# Patient Record
Sex: Female | Born: 1955 | Race: Black or African American | Hispanic: No | Marital: Married | Smoking: Current every day smoker
Health system: Southern US, Community
[De-identification: ages and names within clinical notes are randomized; demographics above are authoritative.]

## PROBLEM LIST (undated history)

## (undated) DIAGNOSIS — M549 Dorsalgia, unspecified: Secondary | ICD-10-CM

## (undated) DIAGNOSIS — F329 Major depressive disorder, single episode, unspecified: Secondary | ICD-10-CM

## (undated) DIAGNOSIS — E119 Type 2 diabetes mellitus without complications: Secondary | ICD-10-CM

## (undated) DIAGNOSIS — F32A Depression, unspecified: Secondary | ICD-10-CM

## (undated) DIAGNOSIS — F419 Anxiety disorder, unspecified: Secondary | ICD-10-CM

## (undated) DIAGNOSIS — G8929 Other chronic pain: Secondary | ICD-10-CM

## (undated) DIAGNOSIS — I10 Essential (primary) hypertension: Secondary | ICD-10-CM

## (undated) DIAGNOSIS — E7800 Pure hypercholesterolemia, unspecified: Secondary | ICD-10-CM

## (undated) DIAGNOSIS — M069 Rheumatoid arthritis, unspecified: Secondary | ICD-10-CM

## (undated) DIAGNOSIS — M5030 Other cervical disc degeneration, unspecified cervical region: Secondary | ICD-10-CM

## (undated) HISTORY — PX: OTHER SURGICAL HISTORY: SHX169

## (undated) HISTORY — PX: CERVICAL DISC SURGERY: SHX588

## (undated) HISTORY — DX: Other chronic pain: G89.29

## (undated) HISTORY — DX: Dorsalgia, unspecified: M54.9

---

## 2012-04-13 ENCOUNTER — Emergency Department (HOSPITAL_BASED_OUTPATIENT_CLINIC_OR_DEPARTMENT_OTHER)
Admission: EM | Admit: 2012-04-13 | Discharge: 2012-04-13 | Disposition: A | Discharge status: Home / Self Care | Payer: Medicaid Other | Attending: Emergency Medicine | Admitting: Emergency Medicine

## 2012-04-13 ENCOUNTER — Encounter (HOSPITAL_BASED_OUTPATIENT_CLINIC_OR_DEPARTMENT_OTHER): Payer: Self-pay | Admitting: Emergency Medicine

## 2012-04-13 DIAGNOSIS — Y93E1 Activity, personal bathing and showering: Secondary | ICD-10-CM | POA: Insufficient documentation

## 2012-04-13 DIAGNOSIS — F329 Major depressive disorder, single episode, unspecified: Secondary | ICD-10-CM | POA: Insufficient documentation

## 2012-04-13 DIAGNOSIS — W010XXA Fall on same level from slipping, tripping and stumbling without subsequent striking against object, initial encounter: Secondary | ICD-10-CM | POA: Insufficient documentation

## 2012-04-13 DIAGNOSIS — F3289 Other specified depressive episodes: Secondary | ICD-10-CM | POA: Insufficient documentation

## 2012-04-13 DIAGNOSIS — M549 Dorsalgia, unspecified: Secondary | ICD-10-CM

## 2012-04-13 DIAGNOSIS — R52 Pain, unspecified: Secondary | ICD-10-CM | POA: Insufficient documentation

## 2012-04-13 DIAGNOSIS — F411 Generalized anxiety disorder: Secondary | ICD-10-CM | POA: Insufficient documentation

## 2012-04-13 DIAGNOSIS — Y92009 Unspecified place in unspecified non-institutional (private) residence as the place of occurrence of the external cause: Secondary | ICD-10-CM | POA: Insufficient documentation

## 2012-04-13 DIAGNOSIS — G8929 Other chronic pain: Secondary | ICD-10-CM | POA: Insufficient documentation

## 2012-04-13 DIAGNOSIS — M069 Rheumatoid arthritis, unspecified: Secondary | ICD-10-CM | POA: Insufficient documentation

## 2012-04-13 DIAGNOSIS — F17200 Nicotine dependence, unspecified, uncomplicated: Secondary | ICD-10-CM | POA: Insufficient documentation

## 2012-04-13 DIAGNOSIS — Z885 Allergy status to narcotic agent status: Secondary | ICD-10-CM | POA: Insufficient documentation

## 2012-04-13 HISTORY — DX: Anxiety disorder, unspecified: F41.9

## 2012-04-13 HISTORY — DX: Depression, unspecified: F32.A

## 2012-04-13 HISTORY — DX: Major depressive disorder, single episode, unspecified: F32.9

## 2012-04-13 HISTORY — DX: Rheumatoid arthritis, unspecified: M06.9

## 2012-04-13 MED ORDER — OXYCODONE-ACETAMINOPHEN 5-325 MG PO TABS: 1.0000 | ORAL_TABLET | ORAL | Status: DC | PRN

## 2012-04-13 MED ORDER — OXYCODONE-ACETAMINOPHEN 5-325 MG PO TABS
1.0000 | ORAL_TABLET | Freq: Once | ORAL | Status: AC
  Administered 2012-04-13: 1 via ORAL
  Filled 2012-04-13 (×2): qty 1

## 2012-04-13 NOTE — ED Provider Notes (Signed)
Medical screening examination/treatment/procedure(s) were performed by non-physician practitioner and as supervising physician I was immediately available for consultation/collaboration.   Carlo Guevarra, MD 04/13/12 2102 

## 2012-04-13 NOTE — ED Notes (Signed)
Reports low back pain after slipping while trying to get out of the bath tub

## 2012-04-13 NOTE — ED Provider Notes (Signed)
History     CSN: [id number]  Arrival date & time 04/13/12  1556   First MD Initiated Contact with Patient 04/13/12 1615      Chief Complaint  Patient presents with  . Back Pain    (Consider location/radiation/quality/duration/timing/severity/associated sxs/prior treatment) Patient is a 56 y.o. female presenting with back pain. The history is provided by the patient.  Back Pain  This is a new problem. Pertinent negatives include no fever, no numbness and no weakness. Associated symptoms comments: Patient with history of rheumatoid arthritis and chronic low back pain fell while in the shower tonight causing exacerbation of familiar back pain. She lost balance and fell onto her side. She did not strike the back against anything. No numbness, paresthesias, extremity weakness, urinary or bowel incontinence..    Past Medical History  Diagnosis Date  . Rheumatoid arthritis   . Anxiety   . Depression     Past Surgical History  Procedure Date  . Disc removed     No family history on file.  History  Substance Use Topics  . Smoking status: Current Every Day Smoker  . Smokeless tobacco: Not on file  . Alcohol Use: No    OB History    Grav Para Term Preterm Abortions TAB SAB Ect Mult Living                  Review of Systems  Constitutional: Negative for fever and chills.  Gastrointestinal: Negative.   Genitourinary: Negative for difficulty urinating.  Musculoskeletal: Positive for back pain.  Skin: Negative.   Neurological: Negative.  Negative for weakness and numbness.    Allergies  Hydrocodone  Home Medications  No current outpatient prescriptions on file.  BP 121/87  Pulse 90  Temp 98.3 F (36.8 C) (Oral)  Resp 16  Ht 5' 9.5" (1.765 m)  Wt 208 lb (94.348 kg)  BMI 30.28 kg/m2  SpO2 99%  Physical Exam  Constitutional: She is oriented to person, place, and time. She appears well-developed and well-nourished.  HENT:  Head: Normocephalic.  Neck: Normal  range of motion. Neck supple.  Cardiovascular: Normal rate and regular rhythm.   Pulmonary/Chest: Effort normal and breath sounds normal.  Abdominal: Soft. Bowel sounds are normal. There is no tenderness. There is no rebound and no guarding.  Musculoskeletal: Normal range of motion.       Mild lumbar and paralumbar tenderness without swelling or discoloration.  Neurological: She is alert and oriented to person, place, and time. She has normal reflexes. Coordination normal.  Skin: Skin is warm and dry. No rash noted.  Psychiatric: She has a normal mood and affect.    ED Course  Procedures (including critical care time)  Labs Reviewed - No data to display No results found.   No diagnosis found. 1. Acute on chronic back pain   MDM  Patient ambulates without ataxia. Acute on chronic back pain without further complication.        Rodena Medin, PA-C 04/13/12 1811

## 2012-09-02 ENCOUNTER — Encounter (HOSPITAL_BASED_OUTPATIENT_CLINIC_OR_DEPARTMENT_OTHER): Payer: Self-pay | Admitting: *Deleted

## 2012-09-02 ENCOUNTER — Emergency Department (HOSPITAL_BASED_OUTPATIENT_CLINIC_OR_DEPARTMENT_OTHER)
Admission: EM | Admit: 2012-09-02 | Discharge: 2012-09-02 | Disposition: A | Discharge status: Home / Self Care | Payer: Medicaid Other | Attending: Emergency Medicine | Admitting: Emergency Medicine

## 2012-09-02 DIAGNOSIS — F329 Major depressive disorder, single episode, unspecified: Secondary | ICD-10-CM | POA: Insufficient documentation

## 2012-09-02 DIAGNOSIS — M545 Low back pain, unspecified: Secondary | ICD-10-CM | POA: Insufficient documentation

## 2012-09-02 DIAGNOSIS — F411 Generalized anxiety disorder: Secondary | ICD-10-CM | POA: Insufficient documentation

## 2012-09-02 DIAGNOSIS — Z9889 Other specified postprocedural states: Secondary | ICD-10-CM | POA: Insufficient documentation

## 2012-09-02 DIAGNOSIS — Z87828 Personal history of other (healed) physical injury and trauma: Secondary | ICD-10-CM | POA: Insufficient documentation

## 2012-09-02 DIAGNOSIS — F3289 Other specified depressive episodes: Secondary | ICD-10-CM | POA: Insufficient documentation

## 2012-09-02 DIAGNOSIS — F17200 Nicotine dependence, unspecified, uncomplicated: Secondary | ICD-10-CM | POA: Insufficient documentation

## 2012-09-02 DIAGNOSIS — M069 Rheumatoid arthritis, unspecified: Secondary | ICD-10-CM | POA: Insufficient documentation

## 2012-09-02 DIAGNOSIS — Z79899 Other long term (current) drug therapy: Secondary | ICD-10-CM | POA: Insufficient documentation

## 2012-09-02 MED ORDER — OXYCODONE-ACETAMINOPHEN 5-325 MG PO TABS: 2.0000 | ORAL_TABLET | ORAL | Status: DC | PRN

## 2012-09-02 MED ORDER — PREDNISONE 10 MG PO TABS: ORAL_TABLET | ORAL | Status: DC

## 2012-09-02 MED ORDER — OXYCODONE-ACETAMINOPHEN 5-325 MG PO TABS
2.0000 | ORAL_TABLET | Freq: Once | ORAL | Status: AC
  Administered 2012-09-02: 2 via ORAL
  Filled 2012-09-02 (×2): qty 2
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
## 2012-09-04 NOTE — ED Provider Notes (Signed)
Medical screening examination/treatment/procedure(s) were performed by non-physician practitioner and as supervising physician I was immediately available for consultation/collaboration.   Scott W. Zackowski, MD 09/04/12 0108 

## 2012-11-18 ENCOUNTER — Emergency Department (HOSPITAL_BASED_OUTPATIENT_CLINIC_OR_DEPARTMENT_OTHER)
Admission: EM | Admit: 2012-11-18 | Discharge: 2012-11-18 | Disposition: A | Discharge status: Home / Self Care | Payer: Medicaid Other | Attending: Emergency Medicine | Admitting: Emergency Medicine

## 2012-11-18 ENCOUNTER — Encounter (HOSPITAL_BASED_OUTPATIENT_CLINIC_OR_DEPARTMENT_OTHER): Payer: Self-pay | Admitting: *Deleted

## 2012-11-18 DIAGNOSIS — F17200 Nicotine dependence, unspecified, uncomplicated: Secondary | ICD-10-CM | POA: Insufficient documentation

## 2012-11-18 DIAGNOSIS — M069 Rheumatoid arthritis, unspecified: Secondary | ICD-10-CM | POA: Insufficient documentation

## 2012-11-18 DIAGNOSIS — F329 Major depressive disorder, single episode, unspecified: Secondary | ICD-10-CM | POA: Insufficient documentation

## 2012-11-18 DIAGNOSIS — Z79899 Other long term (current) drug therapy: Secondary | ICD-10-CM | POA: Insufficient documentation

## 2012-11-18 DIAGNOSIS — Y92410 Unspecified street and highway as the place of occurrence of the external cause: Secondary | ICD-10-CM | POA: Insufficient documentation

## 2012-11-18 DIAGNOSIS — F411 Generalized anxiety disorder: Secondary | ICD-10-CM | POA: Insufficient documentation

## 2012-11-18 DIAGNOSIS — F3289 Other specified depressive episodes: Secondary | ICD-10-CM | POA: Insufficient documentation

## 2012-11-18 DIAGNOSIS — Y9389 Activity, other specified: Secondary | ICD-10-CM | POA: Insufficient documentation

## 2012-11-18 DIAGNOSIS — Z9889 Other specified postprocedural states: Secondary | ICD-10-CM | POA: Insufficient documentation

## 2012-11-18 DIAGNOSIS — S335XXA Sprain of ligaments of lumbar spine, initial encounter: Secondary | ICD-10-CM | POA: Insufficient documentation

## 2012-11-18 DIAGNOSIS — S39012A Strain of muscle, fascia and tendon of lower back, initial encounter: Secondary | ICD-10-CM

## 2012-11-18 MED ORDER — PREDNISONE 10 MG PO TABS: ORAL_TABLET | ORAL | Status: DC

## 2012-11-18 MED ORDER — OXYCODONE-ACETAMINOPHEN 5-325 MG PO TABS: 2.0000 | ORAL_TABLET | ORAL | Status: DC | PRN
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
## 2012-11-18 NOTE — ED Provider Notes (Signed)
  Medical screening examination/treatment/procedure(s) were performed by non-physician practitioner and as supervising physician I was immediately available for consultation/collaboration.    Gerhard Munch, MD 11/18/12 [phone]
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
## 2012-11-24 ENCOUNTER — Ambulatory Visit: Payer: Medicaid Other | Admitting: Family Medicine

## 2012-11-25 ENCOUNTER — Encounter: Payer: Self-pay | Admitting: Family Medicine

## 2012-11-25 ENCOUNTER — Ambulatory Visit (INDEPENDENT_AMBULATORY_CARE_PROVIDER_SITE_OTHER): Payer: Medicaid Other | Admitting: Family Medicine

## 2012-11-25 VITALS — BP 131/88 | HR 82 | Ht 70.0 in | Wt 208.0 lb

## 2012-11-25 DIAGNOSIS — M549 Dorsalgia, unspecified: Secondary | ICD-10-CM

## 2012-11-25 DIAGNOSIS — M25519 Pain in unspecified shoulder: Secondary | ICD-10-CM

## 2012-11-25 DIAGNOSIS — G8929 Other chronic pain: Secondary | ICD-10-CM

## 2012-11-25 DIAGNOSIS — M25511 Pain in right shoulder: Secondary | ICD-10-CM

## 2012-11-25 MED ORDER — MELOXICAM 15 MG PO TABS: 15.0000 mg | ORAL_TABLET | Freq: Every day | ORAL | Status: DC

## 2012-11-25 NOTE — Patient Instructions (Addendum)
You have rotator cuff impingement Try to avoid painful activities (overhead activities, lifting with extended arm) as much as possible. Meloxicam 15mg  daily with food for pain and inflammation. Subacromial injection may be beneficial to help with pain and to decrease inflammation. Do home exercise program with theraband and scapular stabilization exercises daily - these are very important for long term relief even if an injection was given. If not improving at follow-up we will consider further imaging and/or physical therapy. Follow up with me in 6 weeks for your shoulder.  We will refer you to the physiatrist for your chronic low back pain given you've tried most of the conservative measures and have not improved.

## 2012-11-26 ENCOUNTER — Encounter: Payer: Self-pay | Admitting: Family Medicine

## 2012-11-26 DIAGNOSIS — G8929 Other chronic pain: Secondary | ICD-10-CM | POA: Insufficient documentation

## 2012-11-26 DIAGNOSIS — M25511 Pain in right shoulder: Secondary | ICD-10-CM | POA: Insufficient documentation

## 2012-11-26 NOTE — Assessment & Plan Note (Signed)
we do not have anything further to offer her than what she's done to this point.  Has completed extensive physical therapy, nsaids (will place her on meloxicam for this and shoulder though), ESIs, tried cymbalta.  Advised we move forward with PM&R referral for further treatment recommendations.
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
## 2012-11-26 NOTE — Assessment & Plan Note (Signed)
2/2 rotator cuff impingement.  Start home exercise program and meloxicam.  Consider formal PT, subacromial injection, nitro patches if not improving over next 6 weeks.

## 2013-01-06 ENCOUNTER — Ambulatory Visit: Payer: Medicaid Other | Admitting: Family Medicine

## 2013-01-12 ENCOUNTER — Encounter (HOSPITAL_BASED_OUTPATIENT_CLINIC_OR_DEPARTMENT_OTHER): Payer: Self-pay | Admitting: *Deleted

## 2013-01-12 ENCOUNTER — Ambulatory Visit (INDEPENDENT_AMBULATORY_CARE_PROVIDER_SITE_OTHER): Payer: Medicaid Other | Admitting: Family Medicine

## 2013-01-12 ENCOUNTER — Emergency Department (HOSPITAL_BASED_OUTPATIENT_CLINIC_OR_DEPARTMENT_OTHER)
Admission: EM | Admit: 2013-01-12 | Discharge: 2013-01-12 | Disposition: A | Discharge status: Home / Self Care | Payer: Medicaid Other | Attending: Emergency Medicine | Admitting: Emergency Medicine

## 2013-01-12 ENCOUNTER — Encounter: Payer: Self-pay | Admitting: Family Medicine

## 2013-01-12 VITALS — BP 128/85 | HR 69 | Ht 70.0 in | Wt 197.0 lb

## 2013-01-12 DIAGNOSIS — G8929 Other chronic pain: Secondary | ICD-10-CM

## 2013-01-12 DIAGNOSIS — F411 Generalized anxiety disorder: Secondary | ICD-10-CM | POA: Insufficient documentation

## 2013-01-12 DIAGNOSIS — Y929 Unspecified place or not applicable: Secondary | ICD-10-CM | POA: Insufficient documentation

## 2013-01-12 DIAGNOSIS — Y9389 Activity, other specified: Secondary | ICD-10-CM | POA: Insufficient documentation

## 2013-01-12 DIAGNOSIS — W010XXA Fall on same level from slipping, tripping and stumbling without subsequent striking against object, initial encounter: Secondary | ICD-10-CM | POA: Insufficient documentation

## 2013-01-12 DIAGNOSIS — F3289 Other specified depressive episodes: Secondary | ICD-10-CM | POA: Insufficient documentation

## 2013-01-12 DIAGNOSIS — Z8739 Personal history of other diseases of the musculoskeletal system and connective tissue: Secondary | ICD-10-CM | POA: Insufficient documentation

## 2013-01-12 DIAGNOSIS — M25511 Pain in right shoulder: Secondary | ICD-10-CM

## 2013-01-12 DIAGNOSIS — E119 Type 2 diabetes mellitus without complications: Secondary | ICD-10-CM | POA: Insufficient documentation

## 2013-01-12 DIAGNOSIS — M25519 Pain in unspecified shoulder: Secondary | ICD-10-CM

## 2013-01-12 DIAGNOSIS — IMO0002 Reserved for concepts with insufficient information to code with codable children: Secondary | ICD-10-CM | POA: Insufficient documentation

## 2013-01-12 DIAGNOSIS — Z79899 Other long term (current) drug therapy: Secondary | ICD-10-CM | POA: Insufficient documentation

## 2013-01-12 DIAGNOSIS — S46909A Unspecified injury of unspecified muscle, fascia and tendon at shoulder and upper arm level, unspecified arm, initial encounter: Secondary | ICD-10-CM | POA: Insufficient documentation

## 2013-01-12 DIAGNOSIS — S4980XA Other specified injuries of shoulder and upper arm, unspecified arm, initial encounter: Secondary | ICD-10-CM | POA: Insufficient documentation

## 2013-01-12 DIAGNOSIS — M549 Dorsalgia, unspecified: Secondary | ICD-10-CM

## 2013-01-12 DIAGNOSIS — F17200 Nicotine dependence, unspecified, uncomplicated: Secondary | ICD-10-CM | POA: Insufficient documentation

## 2013-01-12 DIAGNOSIS — F329 Major depressive disorder, single episode, unspecified: Secondary | ICD-10-CM | POA: Insufficient documentation

## 2013-01-12 HISTORY — DX: Type 2 diabetes mellitus without complications: E11.9

## 2013-01-12 MED ORDER — DIAZEPAM 5 MG PO TABS: ORAL_TABLET | ORAL | Status: DC

## 2013-01-12 MED ORDER — OXYCODONE-ACETAMINOPHEN 5-325 MG PO TABS: ORAL_TABLET | ORAL | Status: DC

## 2013-01-12 NOTE — Patient Instructions (Addendum)
You have rotator cuff impingement Try to avoid painful activities (overhead activities, lifting with extended arm) as much as possible. Aleve and/or tylenol as needed for pain. Subacromial injection may be beneficial to help with pain and to decrease inflammation - you were given this today. Start physical therapy with transition to home exercise program. Do home exercise program with theraband and scapular stabilization exercises daily - these are very important for long term relief even if an injection was given. If after 2 weeks you're still hurting quite a bit call me and we'll send in the nitro patches - if you use these you cut them into 4 equal pieces and put 1/4th of a patch over the painful area.  Every day you take it off and replace it with a new one. If not improving at follow-up we will consider further imaging. Follow up with me in 6 weeks. We'll refer you to a different pain management clinic.
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
## 2013-01-12 NOTE — ED Notes (Signed)
Patient states she fell 5 days ago and hurt her back.  C/O lower back pain with radiation down buttocks and legs.

## 2013-01-13 ENCOUNTER — Encounter: Payer: Self-pay | Admitting: Family Medicine
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
## 2013-01-13 NOTE — Assessment & Plan Note (Addendum)
2/2 rotator cuff impingement.  Start formal physical therapy and subacromial injection which was given today.  If not improving over next 2-3 weeks advised her to call us and would start nitro patches.    After informed written consent, patient was seated on exam table. Right shoulder was prepped with alcohol swab and utilizing posterior approach, patient's right subacromial space was injected with 3:1 marcaine: depomedrol. Patient tolerated the procedure well without immediate complications.

## 2013-01-13 NOTE — Assessment & Plan Note (Signed)
Not reevaluated today - as noted last visit we do not have anything further to offer her than what she's done to this point.  Has completed extensive physical therapy, nsaids (will place her on meloxicam for this and shoulder though), ESIs, tried cymbalta.  Advised we move forward with PM&R referral for further treatment recommendations.

## 2013-01-13 NOTE — ED Provider Notes (Signed)
Medical screening examination/treatment/procedure(s) were performed by non-physician practitioner and as supervising physician I was immediately available for consultation/collaboration.  Pedram Goodchild, MD 01/13/13 1449 

## 2013-01-22 ENCOUNTER — Other Ambulatory Visit: Payer: Self-pay | Admitting: Family Medicine

## 2013-01-22 ENCOUNTER — Telehealth: Payer: Self-pay | Admitting: *Deleted

## 2013-01-22 DIAGNOSIS — M25511 Pain in right shoulder: Secondary | ICD-10-CM

## 2013-01-22 MED ORDER — NITROGLYCERIN 0.2 MG/HR TD PT24: MEDICATED_PATCH | TRANSDERMAL | Status: DC

## 2013-01-22 NOTE — Telephone Encounter (Signed)
See above

## 2013-02-03 NOTE — Addendum Note (Signed)
Addended by: Lenda Kelp on: 02/03/2013 12:28 PM   Modules accepted: Orders

## 2013-02-04 ENCOUNTER — Emergency Department (HOSPITAL_BASED_OUTPATIENT_CLINIC_OR_DEPARTMENT_OTHER)
Admission: EM | Admit: 2013-02-04 | Discharge: 2013-02-04 | Disposition: A | Discharge status: Home / Self Care | Payer: Medicaid Other | Attending: Emergency Medicine | Admitting: Emergency Medicine

## 2013-02-04 ENCOUNTER — Encounter (HOSPITAL_BASED_OUTPATIENT_CLINIC_OR_DEPARTMENT_OTHER): Payer: Self-pay | Admitting: *Deleted

## 2013-02-04 DIAGNOSIS — M25559 Pain in unspecified hip: Secondary | ICD-10-CM | POA: Insufficient documentation

## 2013-02-04 DIAGNOSIS — M5416 Radiculopathy, lumbar region: Secondary | ICD-10-CM

## 2013-02-04 DIAGNOSIS — F3289 Other specified depressive episodes: Secondary | ICD-10-CM | POA: Insufficient documentation

## 2013-02-04 DIAGNOSIS — Y9389 Activity, other specified: Secondary | ICD-10-CM | POA: Insufficient documentation

## 2013-02-04 DIAGNOSIS — F411 Generalized anxiety disorder: Secondary | ICD-10-CM | POA: Insufficient documentation

## 2013-02-04 DIAGNOSIS — F329 Major depressive disorder, single episode, unspecified: Secondary | ICD-10-CM | POA: Insufficient documentation

## 2013-02-04 DIAGNOSIS — Z9889 Other specified postprocedural states: Secondary | ICD-10-CM | POA: Insufficient documentation

## 2013-02-04 DIAGNOSIS — E119 Type 2 diabetes mellitus without complications: Secondary | ICD-10-CM | POA: Insufficient documentation

## 2013-02-04 DIAGNOSIS — W108XXA Fall (on) (from) other stairs and steps, initial encounter: Secondary | ICD-10-CM | POA: Insufficient documentation

## 2013-02-04 DIAGNOSIS — IMO0002 Reserved for concepts with insufficient information to code with codable children: Secondary | ICD-10-CM | POA: Insufficient documentation

## 2013-02-04 DIAGNOSIS — M069 Rheumatoid arthritis, unspecified: Secondary | ICD-10-CM | POA: Insufficient documentation

## 2013-02-04 DIAGNOSIS — F17200 Nicotine dependence, unspecified, uncomplicated: Secondary | ICD-10-CM | POA: Insufficient documentation

## 2013-02-04 DIAGNOSIS — Z79899 Other long term (current) drug therapy: Secondary | ICD-10-CM | POA: Insufficient documentation

## 2013-02-04 DIAGNOSIS — Y929 Unspecified place or not applicable: Secondary | ICD-10-CM | POA: Insufficient documentation

## 2013-02-04 MED ORDER — HYDROCODONE-ACETAMINOPHEN 5-325 MG PO TABS: 2.0000 | ORAL_TABLET | ORAL | Status: DC | PRN

## 2013-02-04 NOTE — ED Notes (Signed)
While reviewing d/c instructions rx for hydrocodone noted, pt allergies reviewed, pt states hydrocodone makes her itch and have a rash. Dr. Blinda Leatherwood informed, rx for hydrocodone cancelled. Pt instructed to take motrin or tylenol for her pain at home until her pain mgt apt on Monday. Pt verbalizes understanding.

## 2013-02-04 NOTE — ED Notes (Signed)
[facility] phoned, Grayland Jack states rx will be cancelled.

## 2013-02-04 NOTE — ED Notes (Signed)
Pt amb to room 10 with slow steady gait in nad. Pt reports fall on steps Sunday, [facility] and had xrays and mri. Pt states she cont to have pain, saw her pcp this am and was told to come to ed for pain medications. Pt states she has apt with a pain clinic on Monday and just needs enough pain medication to last until then.

## 2013-02-04 NOTE — ED Provider Notes (Signed)
CSN: [id number]     Arrival date & time 02/04/13  1031 History     First MD Initiated Contact with Patient 02/04/13 1039     Chief Complaint  Patient presents with  . Shoulder Pain  . Hip Pain  . Fall   (Consider location/radiation/quality/duration/timing/severity/associated sxs/prior Treatment) HPI Comments: Patient presents to the ER for evaluation of back pain. Patient reports that she has a history of chronic pain, but several days ago had a fall. She reports a twisting motion when she fell and has had left lower back pain radiating into her back since the fall. She reports being seen [facility] and had an MRI. She was told that it was a pinched nerve, has followup scheduled in 4 days with pain management specialist. No change in bowel or bladder function. No loss of strength or sensation in her lower extremities. Patient also complaining of right shoulder pain. She has had chronic pain in the shoulder and have it evaluated during her previous hospital ER visit as well.  Patient is a 57 y.o. female presenting with shoulder pain, hip pain, and fall.  Shoulder Pain  Hip Pain  Fall    Past Medical History  Diagnosis Date  . Rheumatoid arthritis(714.0)   . Anxiety   . Depression   . Chronic back pain   . Diabetes mellitus without complication    Past Surgical History  Procedure Laterality Date  . Disc removed    . Cervical disc surgery     Family History  Problem Relation Age of Onset  . Hyperlipidemia Mother   . Hypertension Mother   . Diabetes Mother   . Hypertension Father   . Diabetes Sister   . Heart attack Neg Hx   . Sudden death Neg Hx    History  Substance Use Topics  . Smoking status: Current Every Day Smoker -- 0.25 packs/day    Types: Cigarettes  . Smokeless tobacco: Never Used  . Alcohol Use: Yes     Comment: occasional   OB History   Grav Para Term Preterm Abortions TAB SAB Ect Mult Living                 Review of Systems  Musculoskeletal:  Positive for back pain and arthralgias.  Neurological: Negative.   All other systems reviewed and are negative.    Allergies  Hydrocodone  Home Medications   Current Outpatient Rx  Name  Route  Sig  Dispense  Refill  . cyclobenzaprine (FLEXERIL) 10 MG tablet   Oral   Take 10 mg by mouth 3 (three) times daily as needed for muscle spasms.         Marland Kitchen atorvastatin (LIPITOR) 40 MG tablet   Oral   Take 40 mg by mouth daily.         . diazepam (VALIUM) 5 MG tablet      Take 1 tablet (5 mg total) by mouth at bedtime as needed for muscle relaxer   5 tablet   0   . HYDROcodone-acetaminophen (NORCO/VICODIN) 5-325 MG per tablet   Oral   Take 2 tablets by mouth every 4 (four) hours as needed for pain.   25 tablet   0   . meloxicam (MOBIC) 15 MG tablet   Oral   Take 1 tablet (15 mg total) by mouth daily. With food.   30 tablet   1   . metFORMIN (GLUCOPHAGE) 500 MG tablet   Oral   Take 500 mg by  mouth 2 (two) times daily with a meal.         . nitroGLYCERIN (NITRODUR - DOSED IN MG/24 HR) 0.2 mg/hr      Use 1/4th of a patch over affected shoulder.  Change daily.   30 patch   1   . oxyCODONE-acetaminophen (PERCOCET/ROXICET) 5-325 MG per tablet      Take one or two tables by mouth every 4 to 6 hours as needed for pain.   15 tablet   0   . predniSONE (STERAPRED UNI-PAK) 10 MG tablet   Oral   Take 10 mg by mouth daily.          BP 137/90  Pulse 87  Temp(Src) 97.5 F (36.4 C) (Oral)  Ht 5\' 9"  (1.753 m)  Wt 211 lb (95.709 kg)  BMI 31.15 kg/m2  SpO2 97% Physical Exam  Constitutional: She is oriented to person, place, and time. She appears well-developed and well-nourished. No distress.  HENT:  Head: Normocephalic and atraumatic.  Right Ear: Hearing normal.  Left Ear: Hearing normal.  Nose: Nose normal.  Mouth/Throat: Oropharynx is clear and moist and mucous membranes are normal.  Eyes: Conjunctivae and EOM are normal. Pupils are equal, round, and reactive  to light.  Neck: Normal range of motion. Neck supple.  Cardiovascular: Regular rhythm, S1 normal and S2 normal.  Exam reveals no gallop and no friction rub.   No murmur heard. Pulmonary/Chest: Effort normal and breath sounds normal. No respiratory distress. She exhibits no tenderness.  Abdominal: Soft. Normal appearance and bowel sounds are normal. There is no hepatosplenomegaly. There is no tenderness. There is no rebound, no guarding, no tenderness at McBurney's point and negative Murphy's sign. No hernia.  Musculoskeletal: Normal range of motion.       Right shoulder: She exhibits tenderness. She exhibits no swelling and no deformity.       Lumbar back: She exhibits tenderness.  Neurological: She is alert and oriented to person, place, and time. She has normal strength. No cranial nerve deficit or sensory deficit. Coordination normal. GCS eye subscore is 4. GCS verbal subscore is 5. GCS motor subscore is 6.  Reflex Scores:      Patellar reflexes are 2+ on the right side and 2+ on the left side. Skin: Skin is warm, dry and intact. No rash noted. No cyanosis.  Psychiatric: She has a normal mood and affect. Her speech is normal and behavior is normal. Thought content normal.    ED Course   Procedures (including critical care time)  Labs Reviewed - No data to display No results found. 1. Lumbar radiculopathy, acute     MDM  Presents with lumbar radiculopathy. She reports having had a workup at [facility]. I do not see any reason to do any further imaging. She has normal neurologic exam. No concern for head injury or neck injury. Patient reports that she has no pain medicine currently but does have followup with pain management specialist Monday morning. Patient will be given additional pain medicine, told to follow up with her pain management specialist.  Gilda Crease, MD 02/04/13 1106

## 2013-02-23 ENCOUNTER — Ambulatory Visit: Payer: Medicaid Other | Admitting: Family Medicine

## 2013-02-25 ENCOUNTER — Other Ambulatory Visit: Payer: Self-pay

## 2013-05-21 ENCOUNTER — Emergency Department (HOSPITAL_BASED_OUTPATIENT_CLINIC_OR_DEPARTMENT_OTHER)
Admission: EM | Admit: 2013-05-21 | Discharge: 2013-05-21 | Disposition: A | Discharge status: Home / Self Care | Payer: Medicaid Other | Attending: Emergency Medicine | Admitting: Emergency Medicine

## 2013-05-21 ENCOUNTER — Encounter (HOSPITAL_BASED_OUTPATIENT_CLINIC_OR_DEPARTMENT_OTHER): Payer: Self-pay | Admitting: Emergency Medicine

## 2013-05-21 DIAGNOSIS — G8929 Other chronic pain: Secondary | ICD-10-CM | POA: Insufficient documentation

## 2013-05-21 DIAGNOSIS — X500XXA Overexertion from strenuous movement or load, initial encounter: Secondary | ICD-10-CM | POA: Insufficient documentation

## 2013-05-21 DIAGNOSIS — IMO0002 Reserved for concepts with insufficient information to code with codable children: Secondary | ICD-10-CM | POA: Insufficient documentation

## 2013-05-21 DIAGNOSIS — I10 Essential (primary) hypertension: Secondary | ICD-10-CM | POA: Insufficient documentation

## 2013-05-21 DIAGNOSIS — E1142 Type 2 diabetes mellitus with diabetic polyneuropathy: Secondary | ICD-10-CM | POA: Insufficient documentation

## 2013-05-21 DIAGNOSIS — M549 Dorsalgia, unspecified: Secondary | ICD-10-CM

## 2013-05-21 DIAGNOSIS — Z79899 Other long term (current) drug therapy: Secondary | ICD-10-CM | POA: Insufficient documentation

## 2013-05-21 DIAGNOSIS — Z8739 Personal history of other diseases of the musculoskeletal system and connective tissue: Secondary | ICD-10-CM | POA: Insufficient documentation

## 2013-05-21 DIAGNOSIS — Y9389 Activity, other specified: Secondary | ICD-10-CM | POA: Insufficient documentation

## 2013-05-21 DIAGNOSIS — E785 Hyperlipidemia, unspecified: Secondary | ICD-10-CM | POA: Insufficient documentation

## 2013-05-21 DIAGNOSIS — Y929 Unspecified place or not applicable: Secondary | ICD-10-CM | POA: Insufficient documentation

## 2013-05-21 DIAGNOSIS — F17200 Nicotine dependence, unspecified, uncomplicated: Secondary | ICD-10-CM | POA: Insufficient documentation

## 2013-05-21 DIAGNOSIS — E1149 Type 2 diabetes mellitus with other diabetic neurological complication: Secondary | ICD-10-CM | POA: Insufficient documentation

## 2013-05-21 DIAGNOSIS — Z8659 Personal history of other mental and behavioral disorders: Secondary | ICD-10-CM | POA: Insufficient documentation

## 2013-05-21 MED ORDER — OXYCODONE-ACETAMINOPHEN 5-325 MG PO TABS: 1.0000 | ORAL_TABLET | Freq: Four times a day (QID) | ORAL | Status: DC | PRN

## 2013-05-21 NOTE — ED Provider Notes (Signed)
TIME SEEN: 12:18 PM  CHIEF COMPLAINT: Back pain  HPI: Patient is a 57 y.o. female with a history of hypertension, diabetes, hyperlipidemia, rheumatoid arthritis not on immunosuppressants, chronic lower back pain who presents emergency department with acute exacerbation of her chronic lower back pain. She reports on Tuesday, 3 days ago, she was getting out of the shower when she twisted her back brought way. She had pain that it began to radiate down her left posterior leg to the level of her knee. She denies any new numbness, tingling or focal weakness. 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 No bowel or bladder incontinence. No fever. She states this is typical exacerbation of her chronic pain.  ROS: See HPI Constitutional: no fever  Eyes: no drainage  ENT: no runny nose   Cardiovascular:  no chest pain  Resp: no SOB  GI: no vomiting GU: no dysuria Integumentary: no rash  Allergy: no hives  Musculoskeletal: no leg swelling  Neurological: no slurred speech ROS otherwise negative  PAST MEDICAL HISTORY/PAST SURGICAL HISTORY:  Past Medical History  Diagnosis Date  . Rheumatoid arthritis(714.0)   . Anxiety   . Depression   . Chronic back pain   . Diabetes mellitus without complication     MEDICATIONS:  Prior to Admission medications   Medication Sig Start Date End Date Taking? Authorizing Provider  zolpidem (AMBIEN) 10 MG tablet Take 10 mg by mouth at bedtime as needed for sleep.   Yes Historical Provider, MD  atorvastatin (LIPITOR) 40 MG tablet Take 40 mg by mouth daily.    Historical Provider, MD  cyclobenzaprine (FLEXERIL) 10 MG tablet Take 10 mg by mouth 3 (three) times daily as needed for muscle spasms.    Historical Provider, MD  metFORMIN (GLUCOPHAGE) 500 MG tablet Take 500 mg by mouth 2 (two) times daily with a meal.    Historical Provider, MD    ALLERGIES:  Allergies  Allergen Reactions  . Hydrocodone Itching    SOCIAL  HISTORY:  History  Substance Use Topics  . Smoking status: Current Every Day Smoker -- 0.25 packs/day    Types: Cigarettes  . Smokeless tobacco: Never Used  . Alcohol Use: Yes     Comment: occasional    FAMILY HISTORY: Family History  Problem Relation Age of Onset  . Hyperlipidemia Mother   . Hypertension Mother   . Diabetes Mother   . Hypertension Father   . Diabetes Sister   . Heart attack Neg Hx   . Sudden death Neg Hx     EXAM: BP 132/96  Pulse 94  Temp(Src) 97.8 F (36.6 C) (Oral)  Resp 22  Ht 5' 9.5" (1.765 m)  Wt 220 lb (99.791 kg)  BMI 32.03 kg/m2  SpO2 99% CONSTITUTIONAL: Alert and oriented and responds appropriately to questions. Well-appearing; well-nourished HEAD: Normocephalic EYES: Conjunctivae clear, PERRL ENT: normal nose; no rhinorrhea; moist mucous membranes; pharynx without lesions noted NECK: Supple, no meningismus, no LAD  CARD: RRR; S1 and S2 appreciated; no murmurs, no clicks, no rubs, no gallops RESP: Normal chest excursion without splinting or tachypnea; breath sounds clear and equal bilaterally; no wheezes, no rhonchi, no rales,  ABD/GI: Normal bowel sounds; non-distended; soft, non-tender, no rebound, no guarding BACK:  The back appears normal and is tender to palpation in her left lower lumbar region with no midline spinal tenderness, step-off or deformity, no CVA tenderness EXT: Normal ROM in all joints; non-tender to palpation; no edema;  normal capillary refill; no cyanosis    SKIN: Normal color for age and race; warm NEURO: Moves all extremities equally; bilateral patellar and Achilles reflexes 2+, strength 5/5 in all 4 extremities, sensation to light touch intact diffusely, normal gait PSYCH: The patient's mood and manner are appropriate. Grooming and personal hygiene are appropriate.  MEDICAL DECISION MAKING: Patient here with acute exacerbation of her chronic back pain. She states she is taking ibuprofen, tramadol and Flexeril at home  without relief. We'll discharge home with prescription for Percocet. She has primary care for followup and has an appointment on December 15. Also has an appointment with pain management on December 16. Given return precautions. Patient verbalizes understanding and is comfortable with plan.      Layla Maw Ward, DO 05/21/13 1237

## 2013-05-21 NOTE — ED Notes (Addendum)
Pt twisted back in shower on Tuesday.  Pt states pain is worsening.  Pt states she has a referral for pain clinic until December 15th.  Pt called PCP and referred her to ED as she would not prescribe any more pain medication.

## 2013-09-13 ENCOUNTER — Emergency Department (HOSPITAL_BASED_OUTPATIENT_CLINIC_OR_DEPARTMENT_OTHER)
Admission: EM | Admit: 2013-09-13 | Discharge: 2013-09-13 | Disposition: A | Discharge status: Home / Self Care | Payer: Medicaid Other | Attending: Emergency Medicine | Admitting: Emergency Medicine

## 2013-09-13 ENCOUNTER — Encounter (HOSPITAL_BASED_OUTPATIENT_CLINIC_OR_DEPARTMENT_OTHER): Payer: Self-pay | Admitting: Emergency Medicine

## 2013-09-13 DIAGNOSIS — E119 Type 2 diabetes mellitus without complications: Secondary | ICD-10-CM | POA: Insufficient documentation

## 2013-09-13 DIAGNOSIS — M549 Dorsalgia, unspecified: Secondary | ICD-10-CM

## 2013-09-13 DIAGNOSIS — Z8739 Personal history of other diseases of the musculoskeletal system and connective tissue: Secondary | ICD-10-CM | POA: Insufficient documentation

## 2013-09-13 DIAGNOSIS — M545 Low back pain, unspecified: Secondary | ICD-10-CM | POA: Insufficient documentation

## 2013-09-13 DIAGNOSIS — Z8659 Personal history of other mental and behavioral disorders: Secondary | ICD-10-CM | POA: Insufficient documentation

## 2013-09-13 DIAGNOSIS — F17200 Nicotine dependence, unspecified, uncomplicated: Secondary | ICD-10-CM | POA: Insufficient documentation

## 2013-09-13 DIAGNOSIS — Z79899 Other long term (current) drug therapy: Secondary | ICD-10-CM | POA: Insufficient documentation

## 2013-09-13 DIAGNOSIS — G8929 Other chronic pain: Secondary | ICD-10-CM | POA: Insufficient documentation

## 2013-09-13 MED ORDER — OXYCODONE-ACETAMINOPHEN 5-325 MG PO TABS: 1.0000 | ORAL_TABLET | ORAL | Status: DC | PRN

## 2013-09-13 MED ORDER — MORPHINE SULFATE 4 MG/ML IJ SOLN
4.0000 mg | Freq: Once | INTRAMUSCULAR | Status: AC
  Administered 2013-09-13: 4 mg via INTRAMUSCULAR
  Filled 2013-09-13: qty 1

## 2013-09-13 NOTE — ED Provider Notes (Signed)
CSN: [id number]     Arrival date & time 09/13/13  1103 History   First MD Initiated Contact with Patient 09/13/13 1105     Chief Complaint  Patient presents with  . Back Pain     (Consider location/radiation/quality/duration/timing/severity/associated sxs/prior Treatment) HPI Comments: Pt states that she has chronic low back pain and she has been seen at the pain clinic and being given suboxone. Pt states that it isn't working so they agreed for her to find a new pain clinic and was told to follow up with her pcp. Pt went to her pcp this morning and they sent her here until she could be seen by another pain management doctor.pt states that she hasn't had suboxone in three days. She states that she is having her chronic pain and she feels hopeless because of everything the is going on. Denies si/hi. No new symptoms. No numbness, weakness or incontinence.  The history is provided by the patient. No language interpreter was used.    Past Medical History  Diagnosis Date  . Rheumatoid arthritis(714.0)   . Anxiety   . Depression   . Chronic back pain   . Diabetes mellitus without complication    Past Surgical History  Procedure Laterality Date  . Disc removed    . Cervical disc surgery     Family History  Problem Relation Age of Onset  . Hyperlipidemia Mother   . Hypertension Mother   . Diabetes Mother   . Hypertension Father   . Diabetes Sister   . Heart attack Neg Hx   . Sudden death Neg Hx    History  Substance Use Topics  . Smoking status: Current Every Day Smoker -- 0.25 packs/day    Types: Cigarettes  . Smokeless tobacco: Never Used  . Alcohol Use: Yes     Comment: occasional   OB History   Grav Para Term Preterm Abortions TAB SAB Ect Mult Living                 Review of Systems  Constitutional: Negative.   Respiratory: Negative.   Cardiovascular: Negative.       Allergies  Hydrocodone  Home Medications   Current Outpatient Rx  Name  Route  Sig   Dispense  Refill  . atorvastatin (LIPITOR) 40 MG tablet   Oral   Take 40 mg by mouth daily.         . cyclobenzaprine (FLEXERIL) 10 MG tablet   Oral   Take 10 mg by mouth 3 (three) times daily as needed for muscle spasms.         . metFORMIN (GLUCOPHAGE) 500 MG tablet   Oral   Take 500 mg by mouth 2 (two) times daily with a meal.         . oxyCODONE-acetaminophen (PERCOCET/ROXICET) 5-325 MG per tablet   Oral   Take 1 tablet by mouth every 6 (six) hours as needed.   20 tablet   0   . zolpidem (AMBIEN) 10 MG tablet   Oral   Take 10 mg by mouth at bedtime as needed for sleep.          Pulse 86  Temp(Src) 98 F (36.7 C) (Oral)  Resp 20  Ht 5' 8.5" (1.74 m)  Wt 220 lb (99.791 kg)  BMI 32.96 kg/m2  SpO2 100% Physical Exam  Nursing note and vitals reviewed. Constitutional: She is oriented to person, place, and time. She appears well-developed and well-nourished.  Cardiovascular: Normal rate  and regular rhythm.   Pulmonary/Chest: Effort normal and breath sounds normal.  Musculoskeletal:  Left lower back generalized tenderness with palpation. Pt has full rom or all extremities  Neurological: She is alert and oriented to person, place, and time.  Skin: Skin is dry.  Psychiatric:  Tearful on exam    ED Course  Procedures (including critical care time) Labs Review Labs Reviewed - No data to display Imaging Review No results found.   EKG Interpretation None      MDM   Final diagnoses:  Chronic back pain    Pt is neurologically intact. Pt given shot of morphine and percocet for home    Teressa Lower, NP 09/13/13 1245

## 2013-09-13 NOTE — ED Notes (Signed)
Chronic lower back pain. States she needs pain medication. She discharged from the pain clinic on Friday. She has not had Suboxone since 8am Friday. Went to her MD this am and she would not give her pain medication and told her to come here.

## 2013-09-13 NOTE — Discharge Instructions (Signed)
Back Pain, Adult  Back pain is very common. The pain often gets better over time. The cause of back pain is usually not dangerous. Most people can learn to manage their back pain on their own.   HOME CARE   · Stay active. Start with short walks on flat ground if you can. Try to walk farther each day.  · Do not sit, drive, or stand in one place for more than 30 minutes. Do not stay in bed.  · Do not avoid exercise or work. Activity can help your back heal faster.  · Be careful when you bend or lift an object. Bend at your knees, keep the object close to you, and do not twist.  · Sleep on a firm mattress. Lie on your side, and bend your knees. If you lie on your back, put a pillow under your knees.  · Only take medicines as told by your doctor.  · Put ice on the injured area.  · Put ice in a plastic bag.  · Place a towel between your skin and the bag.  · Leave the ice on for 15-20 minutes, 03-04 times a day for the first 2 to 3 days. After that, you can switch between ice and heat packs.  · Ask your doctor about back exercises or massage.  · Avoid feeling anxious or stressed. Find good ways to deal with stress, such as exercise.  GET HELP RIGHT AWAY IF:   · Your pain does not go away with rest or medicine.  · Your pain does not go away in 1 week.  · You have new problems.  · You do not feel well.  · The pain spreads into your legs.  · You cannot control when you poop (bowel movement) or pee (urinate).  · Your arms or legs feel weak or lose feeling (numbness).  · You feel sick to your stomach (nauseous) or throw up (vomit).  · You have belly (abdominal) pain.  · You feel like you may pass out (faint).  MAKE SURE YOU:   · Understand these instructions.  · Will watch your condition.  · Will get help right away if you are not doing well or get worse.  Document Released: 12/04/2007 Document Revised: 09/09/2011 Document Reviewed: 11/05/2010  [employer]® Patient Information ©2014 [employer].

## 2013-09-14 NOTE — ED Provider Notes (Signed)
Medical screening examination/treatment/procedure(s) were performed by non-physician practitioner and as supervising physician I was immediately available for consultation/collaboration.   EKG Interpretation None       Toy Baker, MD 09/14/13 [phone]

## 2013-09-22 ENCOUNTER — Emergency Department (HOSPITAL_BASED_OUTPATIENT_CLINIC_OR_DEPARTMENT_OTHER)
Admission: EM | Admit: 2013-09-22 | Discharge: 2013-09-22 | Disposition: A | Discharge status: Home / Self Care | Payer: Medicaid Other | Attending: Emergency Medicine | Admitting: Emergency Medicine

## 2013-09-22 ENCOUNTER — Encounter (HOSPITAL_BASED_OUTPATIENT_CLINIC_OR_DEPARTMENT_OTHER): Payer: Self-pay | Admitting: Emergency Medicine

## 2013-09-22 DIAGNOSIS — M549 Dorsalgia, unspecified: Secondary | ICD-10-CM

## 2013-09-22 DIAGNOSIS — E119 Type 2 diabetes mellitus without complications: Secondary | ICD-10-CM | POA: Insufficient documentation

## 2013-09-22 DIAGNOSIS — M545 Low back pain, unspecified: Secondary | ICD-10-CM | POA: Insufficient documentation

## 2013-09-22 DIAGNOSIS — Z79899 Other long term (current) drug therapy: Secondary | ICD-10-CM | POA: Insufficient documentation

## 2013-09-22 DIAGNOSIS — G8929 Other chronic pain: Secondary | ICD-10-CM | POA: Insufficient documentation

## 2013-09-22 DIAGNOSIS — F17200 Nicotine dependence, unspecified, uncomplicated: Secondary | ICD-10-CM | POA: Insufficient documentation

## 2013-09-22 DIAGNOSIS — Z8739 Personal history of other diseases of the musculoskeletal system and connective tissue: Secondary | ICD-10-CM | POA: Insufficient documentation

## 2013-09-22 DIAGNOSIS — Z8659 Personal history of other mental and behavioral disorders: Secondary | ICD-10-CM | POA: Insufficient documentation

## 2013-09-22 MED ORDER — OXYCODONE-ACETAMINOPHEN 5-325 MG PO TABS: 2.0000 | ORAL_TABLET | ORAL | Status: DC | PRN

## 2013-09-22 NOTE — ED Notes (Signed)
Pt sts she has appt April 9th at [facility] for pain management but is out of pain meds for chronic back pain. Pt was seen here and prescribed narcotic meds recently.

## 2013-09-22 NOTE — ED Provider Notes (Signed)
CSN: [id number]     Arrival date & time 09/22/13  8341 History   First MD Initiated Contact with Patient 09/22/13 1114     Chief Complaint  Patient presents with  . Medication Refill     (Consider location/radiation/quality/duration/timing/severity/associated sxs/prior Treatment) HPI Comments: Patient presents to the ER for evaluation of back pain. Patient reports that she has a history of chronic back pain. She has had cervical surgery but no lumbar surgery. She has been under the management of her pain therapist until this past month. She is making arrangements to have her care transferred to a new pain management specialist, because she was acting relief with the Suboxone that she was getting. That appointment, however, is not until April 9.  The patient presents with persistent low back pain radiating down the legs. This has not changed significantly from her chronic pain. She denies any recent injury. No change in bowel or bladder function. No lower extremity weakness.   Past Medical History  Diagnosis Date  . Rheumatoid arthritis(714.0)   . Anxiety   . Depression   . Chronic back pain   . Diabetes mellitus without complication    Past Surgical History  Procedure Laterality Date  . Disc removed    . Cervical disc surgery     Family History  Problem Relation Age of Onset  . Hyperlipidemia Mother   . Hypertension Mother   . Diabetes Mother   . Hypertension Father   . Diabetes Sister   . Heart attack Neg Hx   . Sudden death Neg Hx    History  Substance Use Topics  . Smoking status: Current Every Day Smoker -- 0.25 packs/day    Types: Cigarettes  . Smokeless tobacco: Never Used  . Alcohol Use: Yes     Comment: occasional   OB History   Grav Para Term Preterm Abortions TAB SAB Ect Mult Living                 Review of Systems  Musculoskeletal: Positive for back pain.  All other systems reviewed and are negative.      Allergies  Hydrocodone  Home  Medications   Current Outpatient Rx  Name  Route  Sig  Dispense  Refill  . atorvastatin (LIPITOR) 40 MG tablet   Oral   Take 40 mg by mouth daily.         . cyclobenzaprine (FLEXERIL) 10 MG tablet   Oral   Take 10 mg by mouth 3 (three) times daily as needed for muscle spasms.         . metFORMIN (GLUCOPHAGE) 500 MG tablet   Oral   Take 500 mg by mouth 2 (two) times daily with a meal.         . oxyCODONE-acetaminophen (PERCOCET/ROXICET) 5-325 MG per tablet   Oral   Take 1 tablet by mouth every 6 (six) hours as needed.   20 tablet   0   . oxyCODONE-acetaminophen (PERCOCET/ROXICET) 5-325 MG per tablet   Oral   Take 1-2 tablets by mouth every 4 (four) hours as needed for severe pain.   20 tablet   0   . zolpidem (AMBIEN) 10 MG tablet   Oral   Take 10 mg by mouth at bedtime as needed for sleep.          BP 144/108  Pulse 81  Temp(Src) 98.3 F (36.8 C) (Oral)  Ht 5\' 9"  (1.753 m)  Wt 209 lb (94.802 kg)  BMI  30.85 kg/m2  SpO2 99% Physical Exam  Constitutional: She is oriented to person, place, and time. She appears well-developed and well-nourished. No distress.  HENT:  Head: Normocephalic and atraumatic.  Right Ear: Hearing normal.  Left Ear: Hearing normal.  Nose: Nose normal.  Mouth/Throat: Oropharynx is clear and moist and mucous membranes are normal.  Eyes: Conjunctivae and EOM are normal. Pupils are equal, round, and reactive to light.  Neck: Normal range of motion. Neck supple.  Cardiovascular: Regular rhythm, S1 normal and S2 normal.  Exam reveals no gallop and no friction rub.   No murmur heard. Pulmonary/Chest: Effort normal and breath sounds normal. No respiratory distress. She exhibits no tenderness.  Abdominal: Soft. Normal appearance and bowel sounds are normal. There is no hepatosplenomegaly. There is no tenderness. There is no rebound, no guarding, no tenderness at McBurney's point and negative Murphy's sign. No hernia.  Musculoskeletal: Normal  range of motion.       Lumbar back: She exhibits tenderness.       Back:  Neurological: She is alert and oriented to person, place, and time. She has normal strength. No cranial nerve deficit or sensory deficit. Coordination normal. GCS eye subscore is 4. GCS verbal subscore is 5. GCS motor subscore is 6.  Skin: Skin is warm, dry and intact. No rash noted. No cyanosis.  Psychiatric: She has a normal mood and affect. Her speech is normal and behavior is normal. Thought content normal.    ED Course  Procedures (including critical care time) Labs Review Labs Reviewed - No data to display Imaging Review No results found.   EKG Interpretation None      MDM   Final diagnoses:  None   Presents with low back pain. She admits that she has had a history of chronic pain. She was seen a pain management specialist and has arranged for her transfer her care to another practice. She has 2 weeks before that visit will occur. Patient was counseled that she cannot get unlimited refills of narcotics in the ER, but she does seem very forthcoming and therefore was given a limited supply, expecting that she will follow up with the pain management specialist on April 9 and receive all of her pain care there.    Gilda Crease, MD 09/22/13 1210

## 2013-09-22 NOTE — ED Notes (Signed)
Patient states she has chronic back pain and is out of her medications.  States she has an appointment with [facility] on 10/07/13.  States she was seen here recently and given percocet and is now out.

## 2013-09-22 NOTE — Discharge Instructions (Signed)
Back Pain, Adult Low back pain is very common. About 1 in 5 people have back pain.The cause of low back pain is rarely dangerous. The pain often gets better over time.About half of people with a sudden onset of back pain feel better in just 2 weeks. About 8 in 10 people feel better by 6 weeks.  CAUSES Some common causes of back pain include:  Strain of the muscles or ligaments supporting the spine.  Wear and tear (degeneration) of the spinal discs.  Arthritis.  Direct injury to the back. DIAGNOSIS Most of the time, the direct cause of low back pain is not known.However, back pain can be treated effectively even when the exact cause of the pain is unknown.Answering your caregiver's questions about your overall health and symptoms is one of the most accurate ways to make sure the cause of your pain is not dangerous. If your caregiver needs more information, he or she may order lab work or imaging tests (X-rays or MRIs).However, even if imaging tests show changes in your back, this usually does not require surgery. HOME CARE INSTRUCTIONS For many people, back pain returns.Since low back pain is rarely dangerous, it is often a condition that people can learn to manageon their own.   Remain active. It is stressful on the back to sit or stand in one place. Do not sit, drive, or stand in one place for more than 30 minutes at a time. Take short walks on level surfaces as soon as pain allows.Try to increase the length of time you walk each day.  Do not stay in bed.Resting more than 1 or 2 days can delay your recovery.  Do not avoid exercise or work.Your body is made to move.It is not dangerous to be active, even though your back may hurt.Your back will likely heal faster if you return to being active before your pain is gone.  Pay attention to your body when you bend and lift. Many people have less discomfortwhen lifting if they bend their knees, keep the load close to their bodies,and  avoid twisting. Often, the most comfortable positions are those that put less stress on your recovering back.  Find a comfortable position to sleep. Use a firm mattress and lie on your side with your knees slightly bent. If you lie on your back, put a pillow under your knees.  Only take over-the-counter or prescription medicines as directed by your caregiver. Over-the-counter medicines to reduce pain and inflammation are often the most helpful.Your caregiver may prescribe muscle relaxant drugs.These medicines help dull your pain so you can more quickly return to your normal activities and healthy exercise.  Put ice on the injured area.  Put ice in a plastic bag.  Place a towel between your skin and the bag.  Leave the ice on for 15-20 minutes, 03-04 times a day for the first 2 to 3 days. After that, ice and heat may be alternated to reduce pain and spasms.  Ask your caregiver about trying back exercises and gentle massage. This may be of some benefit.  Avoid feeling anxious or stressed.Stress increases muscle tension and can worsen back pain.It is important to recognize when you are anxious or stressed and learn ways to manage it.Exercise is a great option. SEEK MEDICAL CARE IF:  You have pain that is not relieved with rest or medicine.  You have pain that does not improve in 1 week.  You have new symptoms.  You are generally not feeling well. SEEK   IMMEDIATE MEDICAL CARE IF:   You have pain that radiates from your back into your legs.  You develop new bowel or bladder control problems.  You have unusual weakness or numbness in your arms or legs.  You develop nausea or vomiting.  You develop abdominal pain.  You feel faint. Document Released: 06/17/2005 Document Revised: 12/17/2011 Document Reviewed: 11/05/2010 [employer] Patient Information 2014 [employer].  

## 2013-11-18 ENCOUNTER — Emergency Department (HOSPITAL_BASED_OUTPATIENT_CLINIC_OR_DEPARTMENT_OTHER)
Admission: EM | Admit: 2013-11-18 | Discharge: 2013-11-18 | Disposition: A | Discharge status: Home / Self Care | Payer: Medicaid Other | Attending: Emergency Medicine | Admitting: Emergency Medicine

## 2013-11-18 ENCOUNTER — Encounter (HOSPITAL_BASED_OUTPATIENT_CLINIC_OR_DEPARTMENT_OTHER): Payer: Self-pay | Admitting: Emergency Medicine

## 2013-11-18 DIAGNOSIS — M549 Dorsalgia, unspecified: Secondary | ICD-10-CM

## 2013-11-18 DIAGNOSIS — M069 Rheumatoid arthritis, unspecified: Secondary | ICD-10-CM | POA: Insufficient documentation

## 2013-11-18 DIAGNOSIS — M545 Low back pain, unspecified: Secondary | ICD-10-CM | POA: Insufficient documentation

## 2013-11-18 DIAGNOSIS — F17200 Nicotine dependence, unspecified, uncomplicated: Secondary | ICD-10-CM | POA: Insufficient documentation

## 2013-11-18 DIAGNOSIS — G8911 Acute pain due to trauma: Secondary | ICD-10-CM | POA: Insufficient documentation

## 2013-11-18 DIAGNOSIS — M25561 Pain in right knee: Secondary | ICD-10-CM

## 2013-11-18 DIAGNOSIS — Z79899 Other long term (current) drug therapy: Secondary | ICD-10-CM | POA: Insufficient documentation

## 2013-11-18 DIAGNOSIS — E119 Type 2 diabetes mellitus without complications: Secondary | ICD-10-CM | POA: Insufficient documentation

## 2013-11-18 DIAGNOSIS — Z8659 Personal history of other mental and behavioral disorders: Secondary | ICD-10-CM | POA: Insufficient documentation

## 2013-11-18 DIAGNOSIS — G8929 Other chronic pain: Secondary | ICD-10-CM | POA: Insufficient documentation

## 2013-11-18 DIAGNOSIS — M25569 Pain in unspecified knee: Secondary | ICD-10-CM | POA: Insufficient documentation

## 2013-11-18 MED ORDER — OXYCODONE-ACETAMINOPHEN 5-325 MG PO TABS: 1.0000 | ORAL_TABLET | Freq: Four times a day (QID) | ORAL | Status: DC | PRN

## 2013-11-18 NOTE — ED Notes (Signed)
Patient states she has chronic osteoarthritis issues with her back and knees.  States she slipped in the shower recently and has had increased pain.

## 2013-11-18 NOTE — ED Provider Notes (Addendum)
CSN: [id number]     Arrival date & time 11/18/13  1054 History   First MD Initiated Contact with Patient 11/18/13 1125     Chief Complaint  Patient presents with  . Back Pain     (Consider location/radiation/quality/duration/timing/severity/associated sxs/prior Treatment) Patient is a 58 y.o. female presenting with back pain. The history is provided by the patient.  Back Pain Location:  Lumbar spine Quality:  Aching and stiffness Stiffness is present:  All day Radiates to:  R thigh and R knee Pain severity:  Moderate Pain is:  Same all the time Timing:  Constant Progression since onset: worse after falling in the shower 2 weeks ago. Chronicity:  Chronic Context: recent injury   Relieved by:  Nothing Worsened by:  Bending and movement Ineffective treatments:  Muscle relaxants Associated symptoms: no abdominal pain, no bladder incontinence, no bowel incontinence, no numbness, no paresthesias, no tingling and no weakness   Risk factors comment:  Hx of arthritis   Past Medical History  Diagnosis Date  . Rheumatoid arthritis(714.0)   . Anxiety   . Depression   . Chronic back pain   . Diabetes mellitus without complication    Past Surgical History  Procedure Laterality Date  . Disc removed    . Cervical disc surgery     Family History  Problem Relation Age of Onset  . Hyperlipidemia Mother   . Hypertension Mother   . Diabetes Mother   . Hypertension Father   . Diabetes Sister   . Heart attack Neg Hx   . Sudden death Neg Hx    History  Substance Use Topics  . Smoking status: Current Every Day Smoker -- 0.25 packs/day    Types: Cigarettes  . Smokeless tobacco: Never Used  . Alcohol Use: Yes     Comment: occasional   OB History   Grav Para Term Preterm Abortions TAB SAB Ect Mult Living                 Review of Systems  Gastrointestinal: Negative for abdominal pain and bowel incontinence.  Genitourinary: Negative for bladder incontinence.  Musculoskeletal:  Positive for back pain.  Neurological: Negative for tingling, weakness, numbness and paresthesias.  All other systems reviewed and are negative.     Allergies  Hydrocodone  Home Medications   Prior to Admission medications   Medication Sig Start Date End Date Taking? Authorizing Provider  atorvastatin (LIPITOR) 40 MG tablet Take 40 mg by mouth daily.    Historical Provider, MD  cyclobenzaprine (FLEXERIL) 10 MG tablet Take 10 mg by mouth 3 (three) times daily as needed for muscle spasms.    Historical Provider, MD  metFORMIN (GLUCOPHAGE) 500 MG tablet Take 500 mg by mouth 2 (two) times daily with a meal.    Historical Provider, MD  oxyCODONE-acetaminophen (PERCOCET) 5-325 MG per tablet Take 2 tablets by mouth every 4 (four) hours as needed. 09/22/13   Gilda Crease, MD  oxyCODONE-acetaminophen (PERCOCET/ROXICET) 5-325 MG per tablet Take 1 tablet by mouth every 6 (six) hours as needed. 05/21/13   Kristen N Ward, DO  oxyCODONE-acetaminophen (PERCOCET/ROXICET) 5-325 MG per tablet Take 1-2 tablets by mouth every 4 (four) hours as needed for severe pain. 09/13/13   Teressa Lower, NP  zolpidem (AMBIEN) 10 MG tablet Take 10 mg by mouth at bedtime as needed for sleep.    Historical Provider, MD   BP 124/86  Pulse 89  Temp(Src) 98.9 F (37.2 C) (Oral)  Resp 20  SpO2  99% Physical Exam  Nursing note and vitals reviewed. Constitutional: She is oriented to person, place, and time. She appears well-developed and well-nourished. No distress.  HENT:  Head: Normocephalic and atraumatic.  Eyes: EOM are normal. Pupils are equal, round, and reactive to light.  Cardiovascular: Normal rate.   Pulmonary/Chest: Effort normal.  Musculoskeletal:  Healing ecchymosis over the right lower femur.  Full ROM of the right knee with mild tenderness to palpation.  No effusion or swelling.  Tenderness with palpation of the lumbar spine  Neurological: She is alert and oriented to person, place, and time.  She has normal strength. No sensory deficit.  Skin: Skin is warm and dry.  Psychiatric: She has a normal mood and affect. Her behavior is normal.    ED Course  Procedures (including critical care time) Labs Review Labs Reviewed - No data to display  Imaging Review No results found.   EKG Interpretation None      MDM   Final diagnoses:  Exacerbation of chronic back pain  Right knee pain    Pt with gradual onset of back pain suggestive of radiculopathy that has been chronic and ongoing but worse after falling in the shower 2 weeks ago.  Got a muscle relaxer from her [facility] but waiting for pain management appt at [place].  No neurovascular compromise and no incontinence.  Pt has no infectious sx, hx of CA  or other red flags concerning for pathologic back pain.  Pt is able to ambulate but is painful.  Normal strength and reflexes on exam.   Will give pt pain control and to return for developement of above sx.     Gwyneth Sprout, MD 11/18/13 1135  Gwyneth Sprout, MD 11/18/13 1136

## 2015-05-30 ENCOUNTER — Encounter: Payer: Self-pay | Admitting: Podiatry

## 2015-05-30 ENCOUNTER — Ambulatory Visit (INDEPENDENT_AMBULATORY_CARE_PROVIDER_SITE_OTHER): Payer: Medicaid Other | Admitting: Podiatry

## 2015-05-30 VITALS — BP 118/86 | HR 95 | Ht 68.0 in | Wt 211.0 lb

## 2015-05-30 DIAGNOSIS — M79604 Pain in right leg: Secondary | ICD-10-CM

## 2015-05-30 DIAGNOSIS — M79673 Pain in unspecified foot: Secondary | ICD-10-CM

## 2015-05-30 DIAGNOSIS — B351 Tinea unguium: Secondary | ICD-10-CM | POA: Diagnosis not present

## 2015-05-30 DIAGNOSIS — E0840 Diabetes mellitus due to underlying condition with diabetic neuropathy, unspecified: Secondary | ICD-10-CM | POA: Insufficient documentation

## 2015-05-30 DIAGNOSIS — E0842 Diabetes mellitus due to underlying condition with diabetic polyneuropathy: Secondary | ICD-10-CM | POA: Diagnosis not present

## 2015-05-30 DIAGNOSIS — L570 Actinic keratosis: Secondary | ICD-10-CM | POA: Insufficient documentation

## 2015-05-30 DIAGNOSIS — M79606 Pain in leg, unspecified: Secondary | ICD-10-CM | POA: Insufficient documentation

## 2015-05-30 NOTE — Progress Notes (Signed)
SUBJECTIVE:  59 y.o. year old female presents for diabetic foot care. Patient complained of painful callus under right foot for about a couple of years. Her feet gets burning like fire and tingling with pain for the past 3 months. Taking nerve pill (Lyrica) that is helping.  Blood sugar is under control. Been diabetic x 1 year. Disabled from DJD in back, anxiety problem.  REVIEW OF SYSTEMS: A comprehensive review of systems was negative except for: DJD in lower back that caused her disability and anxiety problem.   OBJECTIVE: DERMATOLOGIC EXAMINATION: Nails: normal appearing nails bilaterally Positive of painful plantar calluses, broad under the 1st and 5th MPJ right. VASCULAR EXAMINATION OF LOWER LIMBS: Pedal pulses: All pedal pulses are palpable with normal pulsation.  No edema or erythema noted. NEUROLOGIC EXAMINATION OF THE LOWER LIMBS: Achilles DTR is present and within normal. Monofilament (Semmes-Weinstein 10-gm) sensory testing positive 6 out of 6, bilateral. Vibratory sensations(128Hz  turning fork) intact at medial and lateral forefoot bilateral.  Sharp and Dull discriminatory sensations at the plantar ball of hallux is intact bilateral.  Burning sensation on bottom of both feet off and on. Negative of Tinnel sign. No radiating pain upon tapping of Posterior tibial nerve at medial deltoid ligament.  MUSCULOSKELETAL EXAMINATION: Positive for enlarged joints 5th MPJ right and the first MPJ right with callus.  ASSESSMENT: Plantar flexed 1st and 5th with painful calluses. Peripheral neuropathy. NIDDM.  PLAN: Reviewed clinical findings and available treatment options. All lesions debrided.

## 2015-05-30 NOTE — Patient Instructions (Signed)
Seen for hypertrophic calluses. All calluses debrided. May scrub with Salsunblue shampoo and scrub pad to remove dry peeling skin.  Return as needed.

## 2015-11-29 ENCOUNTER — Emergency Department (HOSPITAL_BASED_OUTPATIENT_CLINIC_OR_DEPARTMENT_OTHER)
Admission: EM | Admit: 2015-11-29 | Discharge: 2015-11-29 | Disposition: A | Discharge status: Home / Self Care | Payer: Medicaid Other | Attending: Emergency Medicine | Admitting: Emergency Medicine

## 2015-11-29 ENCOUNTER — Emergency Department (HOSPITAL_BASED_OUTPATIENT_CLINIC_OR_DEPARTMENT_OTHER): Payer: Medicaid Other

## 2015-11-29 ENCOUNTER — Encounter (HOSPITAL_BASED_OUTPATIENT_CLINIC_OR_DEPARTMENT_OTHER): Payer: Self-pay | Admitting: *Deleted

## 2015-11-29 DIAGNOSIS — R079 Chest pain, unspecified: Secondary | ICD-10-CM | POA: Diagnosis not present

## 2015-11-29 DIAGNOSIS — E119 Type 2 diabetes mellitus without complications: Secondary | ICD-10-CM | POA: Insufficient documentation

## 2015-11-29 DIAGNOSIS — M549 Dorsalgia, unspecified: Secondary | ICD-10-CM | POA: Insufficient documentation

## 2015-11-29 DIAGNOSIS — F17210 Nicotine dependence, cigarettes, uncomplicated: Secondary | ICD-10-CM | POA: Insufficient documentation

## 2015-11-29 DIAGNOSIS — F329 Major depressive disorder, single episode, unspecified: Secondary | ICD-10-CM | POA: Diagnosis not present

## 2015-11-29 DIAGNOSIS — M5489 Other dorsalgia: Secondary | ICD-10-CM

## 2015-11-29 DIAGNOSIS — Z7984 Long term (current) use of oral hypoglycemic drugs: Secondary | ICD-10-CM | POA: Insufficient documentation

## 2015-11-29 LAB — CBC WITH DIFFERENTIAL/PLATELET
Basophils Absolute: 0 10*3/uL (ref 0.0–0.1)
Basophils Relative: 0 %
Eosinophils Absolute: 0.2 10*3/uL (ref 0.0–0.7)
Eosinophils Relative: 4 %
HCT: 38.4 % (ref 36.0–46.0)
Hemoglobin: 12.8 g/dL (ref 12.0–15.0)
Lymphocytes Relative: 44 %
Lymphs Abs: 1.9 10*3/uL (ref 0.7–4.0)
MCH: 29.6 pg (ref 26.0–34.0)
MCHC: 33.3 g/dL (ref 30.0–36.0)
MCV: 88.9 fL (ref 78.0–100.0)
Monocytes Absolute: 0.4 10*3/uL (ref 0.1–1.0)
Monocytes Relative: 9 %
Neutro Abs: 1.9 10*3/uL (ref 1.7–7.7)
Neutrophils Relative %: 43 %
Platelets: 239 10*3/uL (ref 150–400)
RBC: 4.32 MIL/uL (ref 3.87–5.11)
RDW: 14 % (ref 11.5–15.5)
WBC: 4.5 10*3/uL (ref 4.0–10.5)

## 2015-11-29 LAB — BASIC METABOLIC PANEL
Anion gap: 6 (ref 5–15)
BUN: 9 mg/dL (ref 6–20)
CO2: 29 mmol/L (ref 22–32)
Calcium: 8.9 mg/dL (ref 8.9–10.3)
Chloride: 105 mmol/L (ref 101–111)
Creatinine, Ser: 0.73 mg/dL (ref 0.44–1.00)
GFR calc Af Amer: >60 mL/min (ref >60)
GFR calc non Af Amer: >60 mL/min (ref >60)
Glucose, Bld: 91 mg/dL (ref 65–99)
Potassium: 3.3 mmol/L — ABNORMAL LOW (ref 3.5–5.1)
Sodium: 140 mmol/L (ref 135–145)

## 2015-11-29 LAB — BRAIN NATRIURETIC PEPTIDE: B Natriuretic Peptide: 16 pg/mL (ref 0.0–100.0)

## 2015-11-29 LAB — TROPONIN I: Troponin I: <0.03 ng/mL (ref <0.031)

## 2015-11-29 MED ORDER — ALBUTEROL SULFATE (2.5 MG/3ML) 0.083% IN NEBU
5.0000 mg | INHALATION_SOLUTION | Freq: Once | RESPIRATORY_TRACT | Status: AC
  Administered 2015-11-29: 5 mg via RESPIRATORY_TRACT
  Filled 2015-11-29: qty 6

## 2015-11-29 MED ORDER — MORPHINE SULFATE (PF) 4 MG/ML IV SOLN
6.0000 mg | Freq: Once | INTRAVENOUS | Status: AC
  Administered 2015-11-29: 6 mg via INTRAVENOUS
  Filled 2015-11-29: qty 2

## 2015-11-29 MED ORDER — KETOROLAC TROMETHAMINE 15 MG/ML IJ SOLN
15.0000 mg | Freq: Once | INTRAMUSCULAR | Status: AC
  Administered 2015-11-29: 15 mg via INTRAVENOUS
  Filled 2015-11-29: qty 1

## 2015-11-29 NOTE — ED Notes (Signed)
Pt states has been having DOE while up moving around, states several times has had to use several pillows at night to sleep or sleep sitting up in a reclyning chair

## 2015-11-29 NOTE — ED Notes (Signed)
Placed on cont cardiac monitoring with cont POX, int NBP monitoring, HOB elevated

## 2015-11-29 NOTE — Discharge Instructions (Signed)

## 2015-11-29 NOTE — ED Notes (Signed)
Monday states fell in the bathtub, denies hitting head, hit on side of rt hip. Has prod cough, onset this past Monday, states it looked brown then became green , thick.

## 2015-12-12 NOTE — ED Provider Notes (Signed)
CSN: [id number]     Arrival date & time 11/29/15  1123 History   First MD Initiated Contact with Patient 11/29/15 1127     Chief Complaint  Patient presents with  . Shortness of Breath     (Consider location/radiation/quality/duration/timing/severity/associated sxs/prior Treatment) HPI   60 year old female with chest and back pain.She felt better Monday. She did not hit her head. She said this pain since then. Worse with movement. She has also had a productive cough. Chest and back pain or worse with this as well. Sputum has been green in color. No fever. No unusual leg pain or swelling. Feel short of breath with moving around. Then at rest.  Past Medical History  Diagnosis Date  . Rheumatoid arthritis(714.0)   . Anxiety   . Depression   . Chronic back pain   . Diabetes mellitus without complication [facility])    Past Surgical History  Procedure Laterality Date  . Disc removed    . Cervical disc surgery     Family History  Problem Relation Age of Onset  . Hyperlipidemia Mother   . Hypertension Mother   . Diabetes Mother   . Hypertension Father   . Diabetes Sister   . Heart attack Neg Hx   . Sudden death Neg Hx    Social History  Substance Use Topics  . Smoking status: Current Every Day Smoker -- 0.25 packs/day    Types: Cigarettes  . Smokeless tobacco: Never Used  . Alcohol Use: 0.0 oz/week    0 Standard drinks or equivalent per week     Comment: occasional   OB History    No data available     Review of Systems  All systems reviewed and negative, other than as noted in HPI.   Allergies  Hydrocodone  Home Medications   Prior to Admission medications   Medication Sig Start Date End Date Taking? Authorizing Provider  ACCU-CHEK AVIVA PLUS test strip USE UTD 05/01/15   Historical Provider, MD  clonazePAM (KLONOPIN) 1 MG tablet Take 1 mg by mouth.    Historical Provider, MD  metFORMIN (GLUCOPHAGE) 500 MG tablet  06/13/14   Historical Provider, MD   oxyCODONE-acetaminophen (PERCOCET) 10-325 MG tablet Take by mouth. 05/30/15   Historical Provider, MD  pravastatin (PRAVACHOL) 20 MG tablet  06/13/14   Historical Provider, MD  pregabalin (LYRICA) 75 MG capsule TAKE ONE CAPSULE BY MOUTH TWICE DAILY 03/02/15   Historical Provider, MD  zolpidem (AMBIEN) 10 MG tablet Take 10 mg by mouth. 02/27/15   Historical Provider, MD   BP 108/66 mmHg  Pulse 89  Temp(Src) 98.6 F (37 C) (Oral)  Resp 15  Ht 5' 9.5" (1.765 m)  Wt 226 lb (102.513 kg)  BMI 32.91 kg/m2  SpO2 91% Physical Exam  Constitutional: She appears well-developed and well-nourished. No distress.  HENT:  Head: Normocephalic and atraumatic.  Eyes: Conjunctivae are normal. Right eye exhibits no discharge. Left eye exhibits no discharge.  Neck: Neck supple.  Cardiovascular: Normal rate, regular rhythm and normal heart sounds.  Exam reveals no gallop and no friction rub.   No murmur heard. Pulmonary/Chest: Effort normal and breath sounds normal. No respiratory distress.  Abdominal: Soft. She exhibits no distension. There is no tenderness.  Musculoskeletal: She exhibits no tenderness.  Lower extremities symmetric as compared to each other. No calf tenderness. Negative Homan's. No palpable cords.   Neurological: She is alert.  Skin: Skin is warm and dry.  Psychiatric: She has a normal mood and affect.  Her behavior is normal. Thought content normal.  Nursing note and vitals reviewed.   ED Course  Procedures (including critical care time) Labs Review Labs Reviewed  BASIC METABOLIC PANEL - Abnormal; Notable for the following:    Potassium 3.3 (*)    All other components within normal limits  CBC WITH DIFFERENTIAL/PLATELET  BRAIN NATRIURETIC PEPTIDE  TROPONIN I    Imaging Review No results found. I have personally reviewed and evaluated these images and lab results as part of my medical decision-making.   EKG Interpretation   Date/Time:  Wednesday Nov 29 2015 11:41:46  EDT Ventricular Rate:  88 PR Interval:  144 QRS Duration: 109 QT Interval:  373 QTC Calculation: 451 R Axis:   1 Text Interpretation:  Sinus rhythm Abnormal R-wave progression, early  transition No old tracing to compare Confirmed by Odessia Asleson  MD, Lamichael Youkhana  ([id number]) on 11/29/2015 2:08:01 PM      MDM   Final diagnoses:  Chest pain, unspecified chest pain type  Other back pain        Raeford Razor, MD 12/12/15 1709

## 2017-08-13 ENCOUNTER — Encounter (HOSPITAL_BASED_OUTPATIENT_CLINIC_OR_DEPARTMENT_OTHER): Payer: Self-pay

## 2017-08-13 ENCOUNTER — Other Ambulatory Visit: Payer: Self-pay

## 2017-08-13 ENCOUNTER — Emergency Department (HOSPITAL_BASED_OUTPATIENT_CLINIC_OR_DEPARTMENT_OTHER): Payer: Medicaid Other

## 2017-08-13 ENCOUNTER — Emergency Department (HOSPITAL_BASED_OUTPATIENT_CLINIC_OR_DEPARTMENT_OTHER)
Admission: EM | Admit: 2017-08-13 | Discharge: 2017-08-13 | Disposition: A | Discharge status: Home / Self Care | Payer: Medicaid Other | Attending: Emergency Medicine | Admitting: Emergency Medicine

## 2017-08-13 DIAGNOSIS — M25551 Pain in right hip: Secondary | ICD-10-CM | POA: Insufficient documentation

## 2017-08-13 DIAGNOSIS — R200 Anesthesia of skin: Secondary | ICD-10-CM | POA: Insufficient documentation

## 2017-08-13 DIAGNOSIS — R296 Repeated falls: Secondary | ICD-10-CM | POA: Diagnosis not present

## 2017-08-13 DIAGNOSIS — F17210 Nicotine dependence, cigarettes, uncomplicated: Secondary | ICD-10-CM | POA: Insufficient documentation

## 2017-08-13 DIAGNOSIS — E119 Type 2 diabetes mellitus without complications: Secondary | ICD-10-CM | POA: Diagnosis not present

## 2017-08-13 DIAGNOSIS — Z79899 Other long term (current) drug therapy: Secondary | ICD-10-CM | POA: Insufficient documentation

## 2017-08-13 HISTORY — DX: Other cervical disc degeneration, unspecified cervical region: M50.30

## 2017-08-13 MED ORDER — HYDROMORPHONE HCL 1 MG/ML IJ SOLN
1.0000 mg | Freq: Once | INTRAMUSCULAR | Status: AC
  Administered 2017-08-13: 1 mg via INTRAMUSCULAR
  Filled 2017-08-13: qty 1

## 2017-08-13 MED ORDER — DICLOFENAC SODIUM 1 % TD GEL: 4.0000 g | Freq: Four times a day (QID) | TRANSDERMAL | 0 refills | Status: DC

## 2017-08-13 MED ORDER — ONDANSETRON 4 MG PO TBDP
4.0000 mg | ORAL_TABLET | Freq: Once | ORAL | Status: AC
  Administered 2017-08-13: 4 mg via ORAL
  Filled 2017-08-13: qty 1

## 2017-08-13 MED FILL — VOLTAREN 1% GEL: 1 | 6 days supply | Qty: 100 | Fill #0

## 2017-08-13 NOTE — Discharge Instructions (Signed)
Please read and follow all provided instructions.  Your diagnoses today include:  1. Pain of right hip joint     Tests performed today include:  An x-ray of the affected area - does NOT show any broken bones  Vital signs. See below for your results today.   Medications prescribed:   Diclofenac gel -anti-inflammatory  Take any prescribed medications only as directed.  Home care instructions:   Follow any educational materials contained in this packet  Follow R.I.C.E. Protocol:  R - rest your injury   I  - use ice on injury without applying directly to skin  C - compress injury with bandage or splint  E - elevate the injury as much as possible  Follow-up instructions: Please follow-up with your primary care provider in the next 1 week.   Return instructions:   Please return if your toes or feet are numb or tingling, appear gray or blue, or you have severe pain (also elevate the leg and loosen splint or wrap if you were given one)  Please return to the Emergency Department if you experience worsening symptoms.   Please return if you have any other emergent concerns.  Additional Information:  Your vital signs today were: BP 136/74    Pulse 79    Temp 98.1 F (36.7 C) (Oral)    Resp 16    Ht 5' 9.5" (1.765 m)    Wt 107 kg (236 lb)    SpO2 99%    BMI 34.35 kg/m  If your blood pressure (BP) was elevated above 135/85 this visit, please have this repeated by your doctor within one month. --------------

## 2017-08-13 NOTE — ED Notes (Signed)
Pt calls out due to pain.  Helped reposition pt into a position of comfort which seemed to lessen pain.

## 2017-08-13 NOTE — ED Notes (Signed)
Patient walked with a walker for assistance, was able to walk to the door, and back to the bed but no further. Dr. Eudelia Bunch was in room for walking.

## 2017-08-13 NOTE — ED Triage Notes (Addendum)
C/o mutiple falls since x 6 months-also c/o numbness to right side of face x 2 weeks-was seen by MD last week for c/o-rx meds given-also c/o pain to right hip and left knee-pt presents to triage in w/c

## 2017-08-13 NOTE — ED Provider Notes (Signed)
[facility] EMERGENCY DEPARTMENT Provider Note   CSN: [id number] Arrival date & time: 08/13/17  1138     History   Chief Complaint Chief Complaint  Patient presents with  . Fall    HPI Cheryl Stark is a 62 y.o. female.  Patient with history of degenerative disc disease, rheumatoid arthritis, chronic back pain, diabetes --presents the emergency department with 2-day history of right hip pain.  Pain is worse with palpation and movement.  She denies acute injury.  She reports falls due to generalized weakness in her lower extremities, however her last fall was December 2018.  Patient had some numbness in her right upper extremity, was seen by her primary care physician who started her on prednisone and gabapentin.  The prednisone has been causing her difficulty sleeping and emotional lability and she does not want to take this any longer.  Pain is described as sharp.  It does not radiate. Patient denies warning symptoms of back pain including: fecal incontinence, urinary retention or overflow incontinence, night sweats, waking from sleep with back pain, unexplained fevers or weight loss, h/o cancer, IVDU, recent trauma.        Past Medical History:  Diagnosis Date  . Anxiety   . Chronic back pain   . DDD (degenerative disc disease), cervical   . Depression   . Diabetes mellitus without complication (HCC)   . Rheumatoid arthritis(714.0)     Patient Active Problem List   Diagnosis Date Noted  . Keratoma 05/30/2015  . Pain in lower limb 05/30/2015  . 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 Chronic back pain 11/26/2012  . Right shoulder pain 11/26/2012    Past Surgical History:  Procedure Laterality Date  . CERVICAL DISC SURGERY    . disc removed      OB History    No data available       Home Medications    Prior to Admission medications   Medication Sig Start Date End Date Taking?  Authorizing Provider  ALPRAZolam Prudy Feeler) 0.5 MG tablet Take 0.5 mg by mouth at bedtime as needed for anxiety.   Yes [provider]  ACCU-CHEK AVIVA PLUS test strip USE UTD 05/01/15   [provider]  diclofenac sodium (VOLTAREN) 1 % GEL Apply 4 g topically 4 (four) times daily. 08/13/17   Renne Crigler, PA-C  oxyCODONE-acetaminophen (PERCOCET) 10-325 MG tablet Take by mouth. 05/30/15   [provider]  pregabalin (LYRICA) 75 MG capsule TAKE ONE CAPSULE BY MOUTH TWICE DAILY 03/02/15   [provider]    Family History Family History  Problem Relation Age of Onset  . Hyperlipidemia Mother   . Hypertension Mother   . Diabetes Mother   . Hypertension Father   . Diabetes Sister   . Heart attack Neg Hx   . Sudden death Neg Hx     Social History Social History   Tobacco Use  . Smoking status: Current Every Day Smoker    Packs/day: 0.25    Types: Cigarettes  . Smokeless tobacco: Never Used  Substance Use Topics  . Alcohol use: No    Alcohol/week: 0.0 oz    Frequency: Never  . Drug use: No     Allergies   Hydrocodone and Tramadol   Review of Systems Review of Systems  Constitutional: Negative for fever and unexpected weight change.  HENT: Negative for rhinorrhea and sore throat.   Eyes: Negative for redness.  Respiratory: Negative  for cough.   Cardiovascular: Negative for chest pain.  Gastrointestinal: Negative for abdominal pain, constipation, diarrhea, nausea and vomiting.       Negative for fecal incontinence.   Genitourinary: Negative for dysuria, flank pain, hematuria, pelvic pain, vaginal bleeding and vaginal discharge.       Negative for urinary incontinence or retention.  Musculoskeletal: Positive for arthralgias, back pain and gait problem. Negative for myalgias.  Skin: Negative for rash.  Neurological: Positive for numbness (R UE paresthesia). Negative for weakness and headaches.       Denies saddle paresthesias.      Physical Exam Updated Vital Signs BP (!) 134/95 (BP Location: Right Arm)   Pulse 71   Temp 98.1 F (36.7 C) (Oral)   Resp 18   Ht 5' 9.5" (1.765 m)   Wt 107 kg (236 lb)   SpO2 99%   BMI 34.35 kg/m   Physical Exam  Constitutional: She appears well-developed and well-nourished.  HENT:  Head: Normocephalic and atraumatic.  Eyes: Conjunctivae are normal. Right eye exhibits no discharge. Left eye exhibits no discharge.  Neck: Normal range of motion. Neck supple.  Cardiovascular: Normal rate, regular rhythm and normal heart sounds.  Pulses:      Dorsalis pedis pulses are 2+ on the right side, and 2+ on the left side.  Pulmonary/Chest: Effort normal and breath sounds normal.  Abdominal: Soft. There is no tenderness.  Musculoskeletal:       Right hip: She exhibits decreased range of motion and tenderness. She exhibits normal strength and no bony tenderness.       Cervical back: She exhibits normal range of motion, no tenderness and no bony tenderness.       Thoracic back: Normal. She exhibits normal range of motion, no tenderness and no bony tenderness.       Lumbar back: Normal. She exhibits normal range of motion, no tenderness and no bony tenderness.       Legs: Neurological: She is alert.  Skin: Skin is warm and dry.  Psychiatric: She has a normal mood and affect.  Nursing note and vitals reviewed.    ED Treatments / Results  Labs (all labs ordered are listed, but only abnormal results are displayed) Labs Reviewed - No data to display  EKG  EKG Interpretation None       Radiology Dg Hip Unilat W Or Wo Pelvis 2-3 Views Right  Result Date: 08/13/2017 CLINICAL DATA:  Right hip pain since a fall in December, 2019. Initial encounter. EXAM: DG HIP (WITH OR WITHOUT PELVIS) 2-3V RIGHT COMPARISON:  None. FINDINGS: There is no evidence of hip fracture or dislocation. There is no evidence of arthropathy or other focal bone abnormality. IMPRESSION: Normal exam.  Electronically Signed   By: Drusilla Kanner M.D.   On: 08/13/2017 14:09    Procedures Procedures (including critical care time)  Medications Ordered in ED Medications  HYDROmorphone (DILAUDID) injection 1 mg (1 mg Intramuscular Given 08/13/17 1325)  ondansetron (ZOFRAN-ODT) disintegrating tablet 4 mg (4 mg Oral Given 08/13/17 1326)     Initial Impression / Assessment and Plan / ED Course  I have reviewed the triage vital signs and the nursing notes.  Pertinent labs & imaging results that were available during my care of the patient were reviewed by me and considered in my medical decision making (see chart for details).     Patient seen and examined. Work-up initiated. Medications ordered. Will treat pain.   Vital signs reviewed and are as  follows: BP (!) 134/95 (BP Location: Right Arm)   Pulse 71   Temp 98.1 F (36.7 C) (Oral)   Resp 18   Ht 5' 9.5" (1.765 m)   Wt 107 kg (236 lb)   SpO2 99%   BMI 34.35 kg/m   Patient informed of x-ray results. Pain stable. Patient discussed with and seen by Dr. Eudelia Bunch.  Patient ambulatory in the room.  Encourage patient to use home medications including muscle relaxer for symptom control.  Encouraged her to follow-up with her PCP for further evaluation and recheck.  Patient urged to return with worsening symptoms or other concerns. Patient verbalized understanding and agrees with plan.    Final Clinical Impressions(s) / ED Diagnoses   Final diagnoses:  Pain of right hip joint   Patient with right hip pain, worse with movement and palpation.  No fevers or other symptoms of infection to suspect septic arthritis.  X-ray is negative.  Patient is ambulatory after pain control here.  Encourage close PCP follow-up for further evaluation.  ED Discharge Orders        Ordered    diclofenac sodium (VOLTAREN) 1 % GEL  4 times daily     08/13/17 1522       Renne Crigler, PA-C 08/13/17 1659    Cardama, Amadeo Garnet, MD 08/16/17  [phone]

## 2017-10-05 ENCOUNTER — Emergency Department (HOSPITAL_BASED_OUTPATIENT_CLINIC_OR_DEPARTMENT_OTHER): Payer: Medicaid Other

## 2017-10-05 ENCOUNTER — Encounter (HOSPITAL_BASED_OUTPATIENT_CLINIC_OR_DEPARTMENT_OTHER): Payer: Self-pay | Admitting: Emergency Medicine

## 2017-10-05 ENCOUNTER — Other Ambulatory Visit: Payer: Self-pay

## 2017-10-05 ENCOUNTER — Emergency Department (HOSPITAL_BASED_OUTPATIENT_CLINIC_OR_DEPARTMENT_OTHER)
Admission: EM | Admit: 2017-10-05 | Discharge: 2017-10-05 | Disposition: A | Discharge status: Home / Self Care | Payer: Medicaid Other | Attending: Emergency Medicine | Admitting: Emergency Medicine

## 2017-10-05 DIAGNOSIS — F17210 Nicotine dependence, cigarettes, uncomplicated: Secondary | ICD-10-CM | POA: Insufficient documentation

## 2017-10-05 DIAGNOSIS — Z79899 Other long term (current) drug therapy: Secondary | ICD-10-CM | POA: Insufficient documentation

## 2017-10-05 DIAGNOSIS — E1140 Type 2 diabetes mellitus with diabetic neuropathy, unspecified: Secondary | ICD-10-CM | POA: Insufficient documentation

## 2017-10-05 DIAGNOSIS — R0789 Other chest pain: Secondary | ICD-10-CM | POA: Diagnosis not present

## 2017-10-05 LAB — CBC WITH DIFFERENTIAL/PLATELET
BASOS ABS: 0 10*3/uL (ref 0.0–0.1)
BASOS PCT: 0 %
EOS ABS: 0 10*3/uL (ref 0.0–0.7)
Eosinophils Relative: 1 %
HCT: 43.1 % (ref 36.0–46.0)
Hemoglobin: 15.2 g/dL — ABNORMAL HIGH (ref 12.0–15.0)
Lymphocytes Relative: 28 %
Lymphs Abs: 1.1 10*3/uL (ref 0.7–4.0)
MCH: 30.6 pg (ref 26.0–34.0)
MCHC: 35.3 g/dL (ref 30.0–36.0)
MCV: 86.7 fL (ref 78.0–100.0)
MONO ABS: 0.3 10*3/uL (ref 0.1–1.0)
MONOS PCT: 7 %
Neutro Abs: 2.5 10*3/uL (ref 1.7–7.7)
Neutrophils Relative %: 64 %
Platelets: 258 10*3/uL (ref 150–400)
RBC: 4.97 MIL/uL (ref 3.87–5.11)
RDW: 14.4 % (ref 11.5–15.5)
WBC: 3.9 10*3/uL — ABNORMAL LOW (ref 4.0–10.5)

## 2017-10-05 LAB — BASIC METABOLIC PANEL
Anion gap: 10 (ref 5–15)
BUN: 11 mg/dL (ref 6–20)
CO2: 23 mmol/L (ref 22–32)
Calcium: 9.2 mg/dL (ref 8.9–10.3)
Chloride: 108 mmol/L (ref 101–111)
Creatinine, Ser: 0.72 mg/dL (ref 0.44–1.00)
GFR calc Af Amer: >60 mL/min (ref >60)
GFR calc non Af Amer: >60 mL/min (ref >60)
Glucose, Bld: 114 mg/dL — ABNORMAL HIGH (ref 65–99)
Potassium: 3.8 mmol/L (ref 3.5–5.1)
Sodium: 141 mmol/L (ref 135–145)

## 2017-10-05 LAB — D-DIMER, QUANTITATIVE (NOT AT ARMC): D DIMER QUANT: 0.74 ug{FEU}/mL — AB (ref 0.00–0.50)

## 2017-10-05 MED ORDER — IOPAMIDOL (ISOVUE-370) INJECTION 76%
100.0000 mL | Freq: Once | INTRAVENOUS | Status: AC | PRN
  Administered 2017-10-05: 73 mL via INTRAVENOUS

## 2017-10-05 MED ORDER — HYDROMORPHONE HCL 1 MG/ML IJ SOLN
1.0000 mg | Freq: Once | INTRAMUSCULAR | Status: AC
  Administered 2017-10-05: 1 mg via INTRAVENOUS
  Filled 2017-10-05: qty 1

## 2017-10-05 NOTE — ED Triage Notes (Addendum)
R side chest pain radiating into her shoulder, sharp in nature, reproducible with touch and worsens with movement.

## 2017-10-05 NOTE — ED Provider Notes (Signed)
[facility] EMERGENCY DEPARTMENT Provider Note   CSN: [id number] Arrival date & time: 10/05/17  1337     History   Chief Complaint Chief Complaint  Patient presents with  . Chest Pain    HPI Cheryl Stark is a 63 y.o. female.  Patient is a 62 year old female with a history of chronic back pain, anxiety, diabetes and rheumatoid arthritis who presents with right-sided chest pain.  She reports a 3-4-day history of worsening pain to her right chest.  It radiates to her right upper back.  There is no radiation down her arm.  No numbness or weakness to her hand.  The pain has been constant and worsening.  It does not go away.  She reports a little bit of associated shortness of breath.  She states it hurts when she moves and hurts when she breathes.  She denies any known injury.  She denies any history of similar symptoms in the past.  She has taken over-the-counter medicines as well as her OxyContin 10 mg tablets with no improvement in symptoms.  Patient does smoke cigarettes.  She denies any leg pain or swelling.     Past Medical History:  Diagnosis Date  . Anxiety   . Chronic back pain   . DDD (degenerative disc disease), cervical   . Depression   . Diabetes mellitus without complication (HCC)   . Rheumatoid arthritis(714.0)     Patient Active Problem List   Diagnosis Date Noted  . Keratoma 05/30/2015  . Pain in lower limb 05/30/2015  . 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 Chronic back pain 11/26/2012  . Right shoulder pain 11/26/2012    Past Surgical History:  Procedure Laterality Date  . CERVICAL DISC SURGERY    . disc removed       OB History   None      Home Medications    Prior to Admission medications   Medication Sig Start Date End Date Taking? Authorizing Provider  ACCU-CHEK AVIVA PLUS test strip USE UTD 05/01/15   [provider]  ALPRAZolam Prudy Feeler) 0.5 MG tablet Take  0.5 mg by mouth at bedtime as needed for anxiety.    [provider]  diclofenac sodium (VOLTAREN) 1 % GEL Apply 4 g topically 4 (four) times daily. 08/13/17   Renne Crigler, PA-C  oxyCODONE-acetaminophen (PERCOCET) 10-325 MG tablet Take by mouth. 05/30/15   [provider]  pregabalin (LYRICA) 75 MG capsule TAKE ONE CAPSULE BY MOUTH TWICE DAILY 03/02/15   [provider]    Family History Family History  Problem Relation Age of Onset  . Hyperlipidemia Mother   . Hypertension Mother   . Diabetes Mother   . Hypertension Father   . Diabetes Sister   . Heart attack Neg Hx   . Sudden death Neg Hx     Social History Social History   Tobacco Use  . Smoking status: Current Every Day Smoker    Packs/day: 0.25    Types: Cigarettes  . Smokeless tobacco: Never Used  Substance Use Topics  . Alcohol use: No    Alcohol/week: 0.0 oz    Frequency: Never  . Drug use: No     Allergies   Hydrocodone and Tramadol   Review of Systems Review of Systems  Constitutional: Negative for chills, diaphoresis, fatigue and fever.  HENT: Negative for congestion, rhinorrhea and sneezing.   Eyes: Negative.   Respiratory: Positive for shortness of breath. Negative  for cough and chest tightness.   Cardiovascular: Positive for chest pain. Negative for leg swelling.  Gastrointestinal: Negative for abdominal pain, blood in stool, diarrhea, nausea and vomiting.  Genitourinary: Negative for difficulty urinating, flank pain, frequency and hematuria.  Musculoskeletal: Negative for arthralgias and back pain.  Skin: Negative for rash.  Neurological: Negative for dizziness, speech difficulty, weakness, numbness and headaches.     Physical Exam Updated Vital Signs BP 126/89   Pulse 82   Temp 98.3 F (36.8 C) (Oral)   Resp (!) 21   Ht 5' 9.5" (1.765 m)   Wt 107 kg (236 lb)   SpO2 98%   BMI 34.35 kg/m   Physical Exam  Constitutional: She is oriented to person, place, and  time. She appears well-developed and well-nourished.  HENT:  Head: Normocephalic and atraumatic.  Eyes: Pupils are equal, round, and reactive to light.  Neck: Normal range of motion. Neck supple.  Cardiovascular: Normal rate, regular rhythm and normal heart sounds.  Pulmonary/Chest: Effort normal and breath sounds normal. No respiratory distress. She has no wheezes. She has no rales. She exhibits tenderness (Positive tenderness to the right chest wall as well as the right upper back and paraspinal muscles.).  Abdominal: Soft. Bowel sounds are normal. There is no tenderness. There is no rebound and no guarding.  Musculoskeletal: Normal range of motion. She exhibits no edema.  No edema or calf tenderness  Lymphadenopathy:    She has no cervical adenopathy.  Neurological: She is alert and oriented to person, place, and time.  Skin: Skin is warm and dry. No rash noted.  Psychiatric: She has a normal mood and affect.     ED Treatments / Results  Labs (all labs ordered are listed, but only abnormal results are displayed) Labs Reviewed  D-DIMER, QUANTITATIVE (NOT AT [facility]) - Abnormal; Notable for the following components:      Result Value   D-Dimer, Quant 0.74 (*)    All other components within normal limits  BASIC METABOLIC PANEL - Abnormal; Notable for the following components:   Glucose, Bld 114 (*)    All other components within normal limits  CBC WITH DIFFERENTIAL/PLATELET - Abnormal; Notable for the following components:   WBC 3.9 (*)    Hemoglobin 15.2 (*)    All other components within normal limits    EKG EKG Interpretation  Date/Time:  Sunday October 05 2017 13:50:38 EDT Ventricular Rate:  92 PR Interval:  136 QRS Duration: 90 QT Interval:  374 QTC Calculation: 462 R Axis:   37 Text Interpretation:  Normal sinus rhythm Normal ECG since last tracing no significant change Confirmed by Rolan Bucco [phone]) on 10/05/2017 3:05:05 PM   Radiology Dg Chest 2 View  Result  Date: 10/05/2017 CLINICAL DATA:  Pt with right sided chest pain from shoulder to hip; smoker; no h/o lung problems; no h/o HBP; some SOB EXAM: CHEST - 2 VIEW COMPARISON:  11/29/2015 FINDINGS: Cardiac silhouette is normal in size and configuration. No mediastinal or hilar masses. No evidence of adenopathy. Clear lungs. No pleural effusion or pneumothorax. Skeletal structures are intact. IMPRESSION: No active cardiopulmonary disease. Electronically Signed   By: Amie Portland M.D.   On: 10/05/2017 14:16   Ct Angio Chest Pe W/cm &/or Wo Cm  Result Date: 10/05/2017 CLINICAL DATA:  Pt with right sided body pain, from right shoulder/chest/abdomen and right hip, pain worse in right hipElevated ddimer 0.74, SOB at times with exertion per pt EXAM: CT ANGIOGRAPHY CHEST WITH CONTRAST  TECHNIQUE: Multidetector CT imaging of the chest was performed using the standard protocol during bolus administration of intravenous contrast. Multiplanar CT image reconstructions and MIPs were obtained to evaluate the vascular anatomy. CONTRAST:  80mL ISOVUE-370 IOPAMIDOL (ISOVUE-370) INJECTION 76% COMPARISON:  None. FINDINGS: Cardiovascular: Satisfactory opacification of the pulmonary arteries to the segmental level. No evidence of pulmonary embolism. Normal heart size. No pericardial effusion. No coronary artery calcifications. Great vessels normal in caliber. No aortic atherosclerosis. Mediastinum/Nodes: No enlarged mediastinal, hilar, or axillary lymph nodes. Thyroid gland, trachea, and esophagus demonstrate no significant findings. Lungs/Pleura: Minor dependent subsegmental atelectasis. Lungs otherwise clear. No pleural effusion or pneumothorax. Upper Abdomen: No acute findings. Musculoskeletal: No fracture or acute finding. No osteoblastic or osteolytic lesions. Review of the MIP images confirms the above findings. IMPRESSION: 1. No evidence of a pulmonary embolism. 2. No acute findings.  No pneumonia or pulmonary edema. Electronically  Signed   By: Amie Portland M.D.   On: 10/05/2017 17:22    Procedures Procedures (including critical care time)  Medications Ordered in ED Medications  HYDROmorphone (DILAUDID) injection 1 mg (1 mg Intravenous Given 10/05/17 1519)  iopamidol (ISOVUE-370) 76 % injection 100 mL (73 mLs Intravenous Contrast Given 10/05/17 1656)     Initial Impression / Assessment and Plan / ED Course  I have reviewed the triage vital signs and the nursing notes.  Pertinent labs & imaging results that were available during my care of the patient were reviewed by me and considered in my medical decision making (see chart for details).     Patient is a 62 year old female who presents with pain in her right upper chest and her upper back.  It seems to be musculoskeletal in nature.  It is reproducible on palpation.  Her EKG does not show any ischemic changes.  Her chest x-ray is clear without evidence of pneumonia.  Her labs are non-concerning.  However her d-dimer was mildly elevated.  Given this, a CT scan of her chest was performed which shows no evidence of PE or other acute abnormality.  She was given Dilaudid for pain.  She has no hypoxia ongoing tachycardia or other concerning findings.  She was discharged home in good condition.  She has OxyContin and muscle relaxers at home to use.  I also encouraged her to use heating pads and follow-up with her PCP.  Return precautions were given.  Final Clinical Impressions(s) / ED Diagnoses   Final diagnoses:  Chest wall pain    ED Discharge Orders    None       Rolan Bucco, MD 10/05/17 1746

## 2017-10-05 NOTE — ED Notes (Signed)
CT will need to wait for bun/creat/egfr to result prior to imaging with IV contrast per [facility] Radiology Protocol, pt age >60 yrs

## 2017-10-05 NOTE — ED Notes (Signed)
ED Provider at bedside. 

## 2017-10-05 NOTE — ED Notes (Signed)
Pt on cardiac monitor and auto VS 

## 2019-05-28 ENCOUNTER — Other Ambulatory Visit (HOSPITAL_COMMUNITY): Payer: Medicaid Other

## 2019-05-31 ENCOUNTER — Ambulatory Visit (HOSPITAL_COMMUNITY): Admission: RE | Admit: 2019-05-31 | Payer: Medicaid Other | Source: Home / Self Care | Admitting: Oral Surgery

## 2019-05-31 ENCOUNTER — Encounter (HOSPITAL_COMMUNITY): Admission: RE | Payer: Self-pay | Source: Home / Self Care

## 2019-05-31 SURGERY — DENTAL RESTORATION/EXTRACTIONS
Anesthesia: General

## 2019-06-04 ENCOUNTER — Ambulatory Visit (HOSPITAL_COMMUNITY): Admission: RE | Admit: 2019-06-04 | Payer: Medicaid Other | Source: Home / Self Care | Admitting: Oral Surgery

## 2019-06-04 ENCOUNTER — Encounter (HOSPITAL_COMMUNITY): Admission: RE | Payer: Self-pay | Source: Home / Self Care

## 2019-06-04 SURGERY — DENTAL RESTORATION/EXTRACTION WITH X-RAY
Anesthesia: General

## 2020-01-23 ENCOUNTER — Emergency Department (HOSPITAL_BASED_OUTPATIENT_CLINIC_OR_DEPARTMENT_OTHER)
Admission: EM | Admit: 2020-01-23 | Discharge: 2020-01-23 | Disposition: A | Discharge status: Home / Self Care | Payer: Medicaid Other | Attending: Emergency Medicine | Admitting: Emergency Medicine

## 2020-01-23 ENCOUNTER — Other Ambulatory Visit: Payer: Self-pay

## 2020-01-23 ENCOUNTER — Encounter (HOSPITAL_BASED_OUTPATIENT_CLINIC_OR_DEPARTMENT_OTHER): Payer: Self-pay | Admitting: Emergency Medicine

## 2020-01-23 ENCOUNTER — Emergency Department (HOSPITAL_BASED_OUTPATIENT_CLINIC_OR_DEPARTMENT_OTHER): Payer: Medicaid Other

## 2020-01-23 DIAGNOSIS — E1140 Type 2 diabetes mellitus with diabetic neuropathy, unspecified: Secondary | ICD-10-CM | POA: Diagnosis not present

## 2020-01-23 DIAGNOSIS — I10 Essential (primary) hypertension: Secondary | ICD-10-CM | POA: Insufficient documentation

## 2020-01-23 DIAGNOSIS — F419 Anxiety disorder, unspecified: Secondary | ICD-10-CM | POA: Insufficient documentation

## 2020-01-23 DIAGNOSIS — R5383 Other fatigue: Secondary | ICD-10-CM | POA: Insufficient documentation

## 2020-01-23 DIAGNOSIS — R0602 Shortness of breath: Secondary | ICD-10-CM | POA: Diagnosis not present

## 2020-01-23 DIAGNOSIS — R1032 Left lower quadrant pain: Secondary | ICD-10-CM | POA: Diagnosis present

## 2020-01-23 DIAGNOSIS — R110 Nausea: Secondary | ICD-10-CM | POA: Diagnosis not present

## 2020-01-23 DIAGNOSIS — F17210 Nicotine dependence, cigarettes, uncomplicated: Secondary | ICD-10-CM | POA: Diagnosis not present

## 2020-01-23 DIAGNOSIS — Z79899 Other long term (current) drug therapy: Secondary | ICD-10-CM | POA: Insufficient documentation

## 2020-01-23 DIAGNOSIS — R638 Other symptoms and signs concerning food and fluid intake: Secondary | ICD-10-CM | POA: Diagnosis not present

## 2020-01-23 HISTORY — DX: Pure hypercholesterolemia, unspecified: E78.00

## 2020-01-23 HISTORY — DX: Essential (primary) hypertension: I10

## 2020-01-23 LAB — CBC WITH DIFFERENTIAL/PLATELET
Abs Immature Granulocytes: 0.01 10*3/uL (ref 0.00–0.07)
Basophils Absolute: 0 10*3/uL (ref 0.0–0.1)
Basophils Relative: 1 %
Eosinophils Absolute: 0.2 10*3/uL (ref 0.0–0.5)
Eosinophils Relative: 4 %
HCT: 46.4 % — ABNORMAL HIGH (ref 36.0–46.0)
Hemoglobin: 15.1 g/dL — ABNORMAL HIGH (ref 12.0–15.0)
Immature Granulocytes: 0 %
Lymphocytes Relative: 48 %
Lymphs Abs: 2.3 10*3/uL (ref 0.7–4.0)
MCH: 28.8 pg (ref 26.0–34.0)
MCHC: 32.5 g/dL (ref 30.0–36.0)
MCV: 88.5 fL (ref 80.0–100.0)
Monocytes Absolute: 0.4 10*3/uL (ref 0.1–1.0)
Monocytes Relative: 9 %
Neutro Abs: 1.8 10*3/uL (ref 1.7–7.7)
Neutrophils Relative %: 38 %
Platelets: 272 10*3/uL (ref 150–400)
RBC: 5.24 MIL/uL — ABNORMAL HIGH (ref 3.87–5.11)
RDW: 13.4 % (ref 11.5–15.5)
WBC: 4.7 10*3/uL (ref 4.0–10.5)
nRBC: 0 % (ref 0.0–0.2)

## 2020-01-23 LAB — COMPREHENSIVE METABOLIC PANEL
ALT: 14 U/L (ref 0–44)
AST: 13 U/L — ABNORMAL LOW (ref 15–41)
Albumin: 4.3 g/dL (ref 3.5–5.0)
Alkaline Phosphatase: 62 U/L (ref 38–126)
Anion gap: 11 (ref 5–15)
BUN: 12 mg/dL (ref 8–23)
CO2: 29 mmol/L (ref 22–32)
Calcium: 9.3 mg/dL (ref 8.9–10.3)
Chloride: 102 mmol/L (ref 98–111)
Creatinine, Ser: 0.82 mg/dL (ref 0.44–1.00)
GFR calc Af Amer: >60 mL/min (ref >60)
GFR calc non Af Amer: >60 mL/min (ref >60)
Glucose, Bld: 120 mg/dL — ABNORMAL HIGH (ref 70–99)
Potassium: 4.1 mmol/L (ref 3.5–5.1)
Sodium: 142 mmol/L (ref 135–145)
Total Bilirubin: 0.3 mg/dL (ref 0.3–1.2)
Total Protein: 7.8 g/dL (ref 6.5–8.1)

## 2020-01-23 LAB — URINALYSIS, ROUTINE W REFLEX MICROSCOPIC
Bilirubin Urine: NEGATIVE
Glucose, UA: NEGATIVE mg/dL
Hgb urine dipstick: NEGATIVE
Ketones, ur: NEGATIVE mg/dL
Leukocytes,Ua: NEGATIVE
Nitrite: NEGATIVE
Protein, ur: NEGATIVE mg/dL
Specific Gravity, Urine: 1.02 (ref 1.005–1.030)
pH: 6.5 (ref 5.0–8.0)

## 2020-01-23 MED ORDER — SODIUM CHLORIDE 0.9 % IV BOLUS
500.0000 mL | Freq: Once | INTRAVENOUS | Status: AC
  Administered 2020-01-23: 500 mL via INTRAVENOUS

## 2020-01-23 MED ORDER — HYDROMORPHONE HCL 1 MG/ML IJ SOLN
0.5000 mg | Freq: Once | INTRAMUSCULAR | Status: AC
  Administered 2020-01-23: 0.5 mg via INTRAVENOUS
  Filled 2020-01-23: qty 1

## 2020-01-23 MED ORDER — IOHEXOL 300 MG/ML  SOLN
100.0000 mL | Freq: Once | INTRAMUSCULAR | Status: AC | PRN
  Administered 2020-01-23: 100 mL via INTRAVENOUS

## 2020-01-23 MED ORDER — ALPRAZOLAM 0.5 MG PO TABS: 0.5000 mg | ORAL_TABLET | Freq: Every evening | ORAL | 0 refills | Status: AC | PRN

## 2020-01-23 MED ORDER — ONDANSETRON HCL 4 MG/2ML IJ SOLN
4.0000 mg | Freq: Once | INTRAMUSCULAR | Status: AC
  Administered 2020-01-23: 4 mg via INTRAVENOUS
  Filled 2020-01-23: qty 2

## 2020-01-23 NOTE — ED Provider Notes (Signed)
[facility] EMERGENCY DEPARTMENT Provider Note   CSN: [id number] Arrival date & time: 01/23/20  0354     History Chief Complaint  Patient presents with  . Abdominal Pain    Cheryl Stark is a 64 y.o. female.  HPI Patient presents with abdominal pain nausea fatigue shortness of breath.  States she has been having difficulty sleeping.  States she gets anxious.  States she has history of anxiety.  States she is normally on Xanax but insurance will not pay for it so she does not have it.  States she is only gotten about 8 hours of sleep total over the last week.  States she is fatigued.  Has pain in the lower abdomen.  It is dull.  No diarrhea.  No fevers.  States she has had somewhat decreased appetite.  Has not had pain like this before.  She is on chronic pain medicine.    Past Medical History:  Diagnosis Date  . Anxiety   . Chronic back pain   . DDD (degenerative disc disease), cervical   . Depression   . Diabetes mellitus without complication (HCC)   . High cholesterol   . Hypertension   . Rheumatoid arthritis(714.0)     Patient Active Problem List   Diagnosis Date Noted  . Keratoma 05/30/2015  . Pain in lower limb 05/30/2015  . 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 Chronic back pain 11/26/2012  . Right shoulder pain 11/26/2012    Past Surgical History:  Procedure Laterality Date  . CERVICAL DISC SURGERY    . disc removed       OB History   No obstetric history on file.     Family History  Problem Relation Age of Onset  . Hyperlipidemia Mother   . Hypertension Mother   . Diabetes Mother   . Hypertension Father   . Diabetes Sister   . Heart attack Neg Hx   . Sudden death Neg Hx     Social History   Tobacco Use  . Smoking status: Current Every Day Smoker    Packs/day: 0.25    Types: Cigarettes  . Smokeless tobacco: Never Used  Substance Use Topics  . Alcohol use: No     Alcohol/week: 0.0 standard drinks  . Drug use: No    Home Medications Prior to Admission medications   Medication Sig Start Date End Date Taking? Authorizing Provider  losartan (COZAAR) 25 MG tablet Take 25 mg by mouth daily.   Yes [provider]  oxyCODONE-acetaminophen (PERCOCET) 10-325 MG tablet Take 1 tablet by mouth every 4 (four) hours as needed for pain.   Yes [provider]  ACCU-CHEK AVIVA PLUS test strip USE UTD 05/01/15   [provider]  ALPRAZolam Prudy Feeler) 0.5 MG tablet Take 1 tablet (0.5 mg total) by mouth at bedtime as needed for anxiety. 01/23/20   Benjiman Core, MD  atorvastatin (LIPITOR) 40 MG tablet Take 40 mg by mouth every evening.    [provider]  fluticasone (FLONASE) 50 MCG/ACT nasal spray Place 1-2 sprays into both nostrils daily as needed for allergies or rhinitis.    [provider]  omeprazole (PRILOSEC) 40 MG capsule Take 40 mg by mouth daily.    [provider]  pregabalin (LYRICA) 225 MG capsule Take 225 mg by mouth 2 (two) times daily.    [provider]    Allergies    Hydrocodone and Tramadol  Review of  Systems   Review of Systems  Constitutional: Positive for appetite change.  HENT: Negative for congestion.   Respiratory: Positive for shortness of breath.   Gastrointestinal: Positive for abdominal pain and nausea.  Genitourinary: Negative for flank pain.  Musculoskeletal: Negative for back pain.  Skin: Negative for rash.  Neurological: Negative for weakness.  Psychiatric/Behavioral: The patient is nervous/anxious.     Physical Exam Updated Vital Signs BP (!) 142/88 (BP Location: Right Arm)   Pulse 67   Temp 98.2 F (36.8 C) (Oral)   Resp 20   Ht 5\' 9"  (1.753 m)   Wt (!) 103.4 kg   SpO2 98%   BMI 33.67 kg/m   Physical Exam Vitals and nursing note reviewed.  HENT:     Head: Normocephalic.  Pulmonary:     Breath sounds: No wheezing, rhonchi or rales.  Abdominal:      Tenderness: There is abdominal tenderness.     Hernia: No hernia is present.     Comments: Left lower quadrant tenderness without rebound or guarding.  No hernia palpated.  Skin:    General: Skin is warm.     Capillary Refill: Capillary refill takes less than 2 seconds.  Neurological:     Mental Status: She is alert and oriented to person, place, and time.     ED Results / Procedures / Treatments   Labs (all labs ordered are listed, but only abnormal results are displayed) Labs Reviewed  CBC WITH DIFFERENTIAL/PLATELET - Abnormal; Notable for the following components:      Result Value   RBC 5.24 (*)    Hemoglobin 15.1 (*)    HCT 46.4 (*)    All other components within normal limits  COMPREHENSIVE METABOLIC PANEL - Abnormal; Notable for the following components:   Glucose, Bld 120 (*)    AST 13 (*)    All other components within normal limits  URINALYSIS, ROUTINE W REFLEX MICROSCOPIC    EKG None  Radiology CT ABDOMEN PELVIS W CONTRAST  Result Date: 01/23/2020 CLINICAL DATA:  Abdominal pain, nausea, fatigue, shortness of breath EXAM: CT ABDOMEN AND PELVIS WITH CONTRAST TECHNIQUE: Multidetector CT imaging of the abdomen and pelvis was performed using the standard protocol following bolus administration of intravenous contrast. CONTRAST:  01/25/2020 OMNIPAQUE IOHEXOL 300 MG/ML  SOLN COMPARISON:  10/14/2008 by report only FINDINGS: Lower chest: No acute abnormality. Hepatobiliary: No focal liver abnormality is seen. No gallstones, gallbladder wall thickening, or biliary dilatation. Pancreas: Unremarkable. No pancreatic ductal dilatation or surrounding inflammatory changes. Spleen: Normal in size without focal abnormality. Adrenals/Urinary Tract: 2 cm probable cyst, upper pole right kidney. 3 mm calculus, lower pole left renal collecting system. No hydronephrosis. Urinary bladder incompletely distended. Adrenal glands unremarkable. Stomach/Bowel: Stomach is nondistended. Small bowel  decompressed. Normal appendix. The colon is nondilated, unremarkable. Vascular/Lymphatic: Minimal aortoiliac calcified plaque without aneurysm or evident stenosis. No abdominal or pelvic adenopathy. Reproductive: Uterus and bilateral adnexa are unremarkable. Other: Bilateral pelvic phleboliths.  No ascites.  No free air. Musculoskeletal: Small paraumbilical hernia containing only mesenteric fat. Degenerative disc disease L5-S1. Negative for fracture or worrisome bone lesion. IMPRESSION: 1. No acute findings. 2. Left nephrolithiasis without hydronephrosis. 3. Small paraumbilical hernia containing only mesenteric fat. Aortic Atherosclerosis (ICD10-I70.0). Electronically Signed   By: 10/16/2008 M.D.   On: 01/23/2020 11:39    Procedures Procedures (including critical care time)  Medications Ordered in ED Medications  sodium chloride 0.9 % bolus 500 mL (0 mLs Intravenous Stopped 01/23/20 1208)  ondansetron (  ZOFRAN) injection 4 mg (4 mg Intravenous Given 01/23/20 1040)  HYDROmorphone (DILAUDID) injection 0.5 mg (0.5 mg Intravenous Given 01/23/20 1040)  iohexol (OMNIPAQUE) 300 MG/ML solution 100 mL (100 mLs Intravenous Contrast Given 01/23/20 1116)    ED Course  I have reviewed the triage vital signs and the nursing notes.  Pertinent labs & imaging results that were available during my care of the patient were reviewed by me and considered in my medical decision making (see chart for details).    MDM Rules/Calculators/A&P                          Patient with lower abdominal pain nausea and some fatigue.  Some shortness of breath.  Also states severe anxiety.  States difficulty sleeping.  Patient feels somewhat better after treatment.  CT scan reassuring does not show clear cause of pain.  Discussed with patient and she will follow up as an outpatient with Dr. Julio Sicks and the psychiatry follow-up she is having arranged.  Will give 3 pills of Xanax to help with the sleep, she has been on this  previously.  Drug database reviewed.  Discharge home. Final Clinical Impression(s) / ED Diagnoses Final diagnoses:  Left lower quadrant abdominal pain  Anxiety    Rx / DC Orders ED Discharge Orders         Ordered    ALPRAZolam (XANAX) 0.5 MG tablet  At bedtime PRN     Discontinue  Reprint     01/23/20 1312           Benjiman Core, MD 01/23/20 1315

## 2020-01-23 NOTE — ED Triage Notes (Signed)
Low abd pain with associated nausea, fatigue, and SOB x 1 week

## 2020-01-24 ENCOUNTER — Emergency Department (HOSPITAL_BASED_OUTPATIENT_CLINIC_OR_DEPARTMENT_OTHER)
Admission: EM | Admit: 2020-01-24 | Discharge: 2020-01-24 | Discharge status: Against Medical Advice | Payer: Medicaid Other | Attending: Emergency Medicine | Admitting: Emergency Medicine

## 2020-01-24 ENCOUNTER — Other Ambulatory Visit: Payer: Self-pay

## 2020-01-24 ENCOUNTER — Encounter (HOSPITAL_BASED_OUTPATIENT_CLINIC_OR_DEPARTMENT_OTHER): Payer: Self-pay | Admitting: *Deleted

## 2020-01-24 DIAGNOSIS — Z7984 Long term (current) use of oral hypoglycemic drugs: Secondary | ICD-10-CM | POA: Diagnosis not present

## 2020-01-24 DIAGNOSIS — I10 Essential (primary) hypertension: Secondary | ICD-10-CM | POA: Insufficient documentation

## 2020-01-24 DIAGNOSIS — F17210 Nicotine dependence, cigarettes, uncomplicated: Secondary | ICD-10-CM | POA: Insufficient documentation

## 2020-01-24 DIAGNOSIS — E1140 Type 2 diabetes mellitus with diabetic neuropathy, unspecified: Secondary | ICD-10-CM | POA: Diagnosis not present

## 2020-01-24 DIAGNOSIS — Z79899 Other long term (current) drug therapy: Secondary | ICD-10-CM | POA: Insufficient documentation

## 2020-01-24 DIAGNOSIS — R1032 Left lower quadrant pain: Secondary | ICD-10-CM | POA: Diagnosis present

## 2020-01-24 MED ORDER — ONDANSETRON 4 MG PO TBDP
4.0000 mg | ORAL_TABLET | Freq: Once | ORAL | Status: AC
  Administered 2020-01-24: 4 mg via ORAL
  Filled 2020-01-24: qty 1

## 2020-01-24 MED ORDER — HYDROMORPHONE HCL 1 MG/ML IJ SOLN
2.0000 mg | Freq: Once | INTRAMUSCULAR | Status: AC
  Administered 2020-01-24: 2 mg via INTRAMUSCULAR
  Filled 2020-01-24: qty 2

## 2020-01-24 NOTE — ED Triage Notes (Signed)
Lower abdominal pain. States she is exhausted from not sleeping due to pain. She was seen here yesterday for same and was told her exam was normal.

## 2020-01-24 NOTE — Discharge Instructions (Signed)
Please read and follow all provided instructions.  Your diagnoses today include:  1. Left lower quadrant abdominal pain     Tests performed today include:  Vital signs. See below for your results today.   Medications prescribed:   None  Take any prescribed medications only as directed.  Home care instructions:   Follow any educational materials contained in this packet.  Follow-up instructions: Please follow-up with your primary care provider in the next 3 days for further evaluation of your symptoms.    Return instructions:  SEEK IMMEDIATE MEDICAL ATTENTION IF:  The pain does not go away or becomes severe   A temperature above 101F develops   Repeated vomiting occurs (multiple episodes)   The pain becomes localized to portions of the abdomen. The right side could possibly be appendicitis. In an adult, the left lower portion of the abdomen could be colitis or diverticulitis.   Blood is being passed in stools or vomit (bright red or black tarry stools)   You develop chest pain, difficulty breathing, dizziness or fainting, or become confused, poorly responsive, or inconsolable (young children)  If you have any other emergent concerns regarding your health  Additional Information: Abdominal (belly) pain can be caused by many things. Your caregiver performed an examination and possibly ordered blood/urine tests and imaging (CT scan, x-rays, ultrasound). Many cases can be observed and treated at home after initial evaluation in the emergency department. Even though you are being discharged home, abdominal pain can be unpredictable. Therefore, you need a repeated exam if your pain does not resolve, returns, or worsens. Most patients with abdominal pain don't have to be admitted to the hospital or have surgery, but serious problems like appendicitis and gallbladder attacks can start out as nonspecific pain. Many abdominal conditions cannot be diagnosed in one visit, so follow-up  evaluations are very important.  Your vital signs today were: BP (!) 121/86   Pulse 78   Temp 98.6 F (37 C) (Oral)   Resp 17   Ht 5\' 9"  (1.753 m)   Wt (!) 103.4 kg   SpO2 97%   BMI 33.66 kg/m  If your blood pressure (bp) was elevated above 135/85 this visit, please have this repeated by your doctor within one month. --------------

## 2020-01-24 NOTE — ED Notes (Signed)
Pt. Said the dilaudid she had yesterday was not enough she takes oxycodone 10mg s every 6 hrs but she is not getting any relief.  She reports she is not gettting any relief.

## 2020-01-24 NOTE — ED Provider Notes (Signed)
[facility] EMERGENCY DEPARTMENT Provider Note   CSN: [id number] Arrival date & time: 01/24/20  1626     History Chief Complaint  Patient presents with  . Abdominal Pain    Cheryl Stark is a 64 y.o. female.  Patient with history of diabetes, chronic lower back pain on chronic opioids --return to the emergency department today for abdominal pain going on for about 4 days.  Patient was seen in the emergency department yesterday.  She had reassuring lab work and a CT without explanation of pain.  She states that she went to her primary care doctor today because her symptoms were not improved, she was encouraged to go back to the emergency department.  She states that she has not been getting relief with her daily pain medication.  She denies fevers, chills, vomiting or diarrhea.  No urinary symptoms.  Pain is across the lower abdomen, worse in the left lower quadrant.  She reports having uncontrolled anxiety and difficulty sleeping which is making her symptoms worse.  The onset of this condition was acute. The course is constant. Aggravating factors: none. Alleviating factors: none.          Past Medical History:  Diagnosis Date  . Anxiety   . Chronic back pain   . DDD (degenerative disc disease), cervical   . Depression   . Diabetes mellitus without complication (HCC)   . High cholesterol   . Hypertension   . Rheumatoid arthritis(714.0)     Patient Active Problem List   Diagnosis Date Noted  . Keratoma 05/30/2015  . Pain in lower limb 05/30/2015  . 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 Chronic back pain 11/26/2012  . Right shoulder pain 11/26/2012    Past Surgical History:  Procedure Laterality Date  . CERVICAL DISC SURGERY    . disc removed       OB History   No obstetric history on file.     Family History  Problem Relation Age of Onset  . Hyperlipidemia Mother   . Hypertension  Mother   . Diabetes Mother   . Hypertension Father   . Diabetes Sister   . Heart attack Neg Hx   . Sudden death Neg Hx     Social History   Tobacco Use  . Smoking status: Current Every Day Smoker    Packs/day: 0.25    Types: Cigarettes  . Smokeless tobacco: Never Used  Substance Use Topics  . Alcohol use: No    Alcohol/week: 0.0 standard drinks  . Drug use: No    Home Medications Prior to Admission medications   Medication Sig Start Date End Date Taking? Authorizing Provider  ACCU-CHEK AVIVA PLUS test strip USE UTD 05/01/15   [provider]  ALPRAZolam Prudy Feeler) 0.5 MG tablet Take 1 tablet (0.5 mg total) by mouth at bedtime as needed for anxiety. 01/23/20   Benjiman Core, MD  atorvastatin (LIPITOR) 40 MG tablet Take 40 mg by mouth every evening.    [provider]  fluticasone (FLONASE) 50 MCG/ACT nasal spray Place 1-2 sprays into both nostrils daily as needed for allergies or rhinitis.    [provider]  losartan (COZAAR) 25 MG tablet Take 25 mg by mouth daily.    [provider]  omeprazole (PRILOSEC) 40 MG capsule Take 40 mg by mouth daily.    [provider]  oxyCODONE-acetaminophen (PERCOCET) 10-325 MG tablet Take 1 tablet by mouth every 4 (four) hours  as needed for pain.    [provider]  pregabalin (LYRICA) 225 MG capsule Take 225 mg by mouth 2 (two) times daily.    [provider]    Allergies    Hydrocodone and Tramadol  Review of Systems   Review of Systems  Constitutional: Negative for fever.  HENT: Negative for rhinorrhea and sore throat.   Eyes: Negative for redness.  Respiratory: Negative for cough.   Cardiovascular: Negative for chest pain.  Gastrointestinal: Positive for abdominal pain. Negative for diarrhea, nausea and vomiting.  Genitourinary: Negative for dysuria, frequency, hematuria and urgency.  Musculoskeletal: Negative for myalgias.  Skin: Negative for rash.  Neurological:  Negative for headaches.  Psychiatric/Behavioral: Positive for sleep disturbance. The patient is nervous/anxious.     Physical Exam Updated Vital Signs BP (!) 121/86   Pulse 78   Temp 98.6 F (37 C) (Oral)   Resp 17   Ht 5\' 9"  (1.753 m)   Wt (!) 103.4 kg   SpO2 97%   BMI 33.66 kg/m   Physical Exam Vitals and nursing note reviewed.  Constitutional:      Appearance: She is well-developed.  HENT:     Head: Normocephalic and atraumatic.  Eyes:     Conjunctiva/sclera: Conjunctivae normal.  Cardiovascular:     Rate and Rhythm: Normal rate.  Pulmonary:     Effort: No respiratory distress.  Abdominal:     General: There is no distension.     Palpations: Abdomen is soft.     Tenderness: There is abdominal tenderness in the suprapubic area and left lower quadrant. There is no guarding or rebound.  Musculoskeletal:     Cervical back: Normal range of motion and neck supple.  Skin:    General: Skin is warm and dry.  Neurological:     General: No focal deficit present.     Mental Status: She is alert and oriented to person, place, and time.  Psychiatric:        Mood and Affect: Mood normal.     ED Results / Procedures / Treatments   Labs (all labs ordered are listed, but only abnormal results are displayed) Labs Reviewed - No data to display  EKG EKG Interpretation  Date/Time:  Monday January 24 2020 16:39:06 EDT Ventricular Rate:  86 PR Interval:  140 QRS Duration: 86 QT Interval:  376 QTC Calculation: 449 R Axis:   36 Text Interpretation: Normal sinus rhythm Nonspecific ST abnormality Abnormal ECG no sig change from previous Confirmed by [id number] [phone]) on 01/24/2020 4:59:05 PM   Radiology CT ABDOMEN PELVIS W CONTRAST  Result Date: 01/23/2020 CLINICAL DATA:  Abdominal pain, nausea, fatigue, shortness of breath EXAM: CT ABDOMEN AND PELVIS WITH CONTRAST TECHNIQUE: Multidetector CT imaging of the abdomen and pelvis was performed using the standard protocol  following bolus administration of intravenous contrast. CONTRAST:  01/25/2020 OMNIPAQUE IOHEXOL 300 MG/ML  SOLN COMPARISON:  10/14/2008 by report only FINDINGS: Lower chest: No acute abnormality. Hepatobiliary: No focal liver abnormality is seen. No gallstones, gallbladder wall thickening, or biliary dilatation. Pancreas: Unremarkable. No pancreatic ductal dilatation or surrounding inflammatory changes. Spleen: Normal in size without focal abnormality. Adrenals/Urinary Tract: 2 cm probable cyst, upper pole right kidney. 3 mm calculus, lower pole left renal collecting system. No hydronephrosis. Urinary bladder incompletely distended. Adrenal glands unremarkable. Stomach/Bowel: Stomach is nondistended. Small bowel decompressed. Normal appendix. The colon is nondilated, unremarkable. Vascular/Lymphatic: Minimal aortoiliac calcified plaque without aneurysm or evident stenosis. No abdominal or pelvic adenopathy. Reproductive:  Uterus and bilateral adnexa are unremarkable. Other: Bilateral pelvic phleboliths.  No ascites.  No free air. Musculoskeletal: Small paraumbilical hernia containing only mesenteric fat. Degenerative disc disease L5-S1. Negative for fracture or worrisome bone lesion. IMPRESSION: 1. No acute findings. 2. Left nephrolithiasis without hydronephrosis. 3. Small paraumbilical hernia containing only mesenteric fat. Aortic Atherosclerosis (ICD10-I70.0). Electronically Signed   By: Corlis Leak M.D.   On: 01/23/2020 11:39    Procedures Procedures (including critical care time)  Medications Ordered in ED Medications  HYDROmorphone (DILAUDID) injection 2 mg (has no administration in time range)  ondansetron (ZOFRAN-ODT) disintegrating tablet 4 mg (4 mg Oral Given 01/24/20 2059)    ED Course  I have reviewed the triage vital signs and the nursing notes.  Pertinent labs & imaging results that were available during my care of the patient were reviewed by me and considered in my medical decision making (see  chart for details).  Patient seen and examined.  I spent time with the patient bedside reviewing her results from yesterday including blood work and CT imaging.  She states that she really does not know why she is here today.  She is requesting with helping her symptoms under control.  I offered repeat lab work to ensure that nothing is changed from yesterday, and she declines stating she does not really feel like she needs that.  She is willing to monitor her symptoms at home and follow-up with her doctor as needed.  Will give dose of IM Dilaudid.  Patient is opioid tolerant.  Vital signs reviewed and are as follows: BP (!) 121/86   Pulse 78   Temp 98.6 F (37 C) (Oral)   Resp 17   Ht 5\' 9"  (1.753 m)   Wt (!) 103.4 kg   SpO2 97%   BMI 33.66 kg/m   The patient was urged to return to the Emergency Department immediately with worsening of current symptoms, worsening abdominal pain, persistent vomiting, blood noted in stools, fever, or any other concerns. The patient verbalized understanding.      MDM Rules/Calculators/A&P                          Patient with abdominal pain, she reports unchanged from yesterday. Vitals are stable, no fever. Labs offered repeat labs, however she declines. Imaging performed yesterday reviewed. No signs of dehydration, patient is tolerating PO's. Lungs are clear and no signs suggestive of PNA. Low concern for appendicitis, cholecystitis, pancreatitis, ruptured viscus, UTI, kidney stone, aortic dissection, aortic aneurysm or other emergent abdominal etiology. Supportive therapy indicated with return if symptoms worsen.  The intrarenal stone seen yesterday is not likely causing pain.  We discussed her right kidney cyst, likely benign and unrelated.   Final Clinical Impression(s) / ED Diagnoses Final diagnoses:  Left lower quadrant abdominal pain    Rx / DC Orders ED Discharge Orders    None       , Renne Crigler 01/24/20 2103    2104, MD 01/30/20 1124

## 2020-01-24 NOTE — ED Notes (Signed)
EDP going in to see Pt. At this time.  Pt. To rest room and asking when she will be seen.

## 2021-09-27 IMAGING — CT CT ABD-PELV W/ CM
2 of 5 series · 16 of 46 positions shown, 18 images · IV contrast (Omnipaque)
Comparison: 10/14/2008 by report only

CLINICAL DATA: Abdominal pain, nausea, fatigue, shortness of breath

EXAM:
CT ABDOMEN AND PELVIS WITH CONTRAST
TECHNIQUE: Multidetector CT imaging of the abdomen and pelvis was performed
using the standard protocol following bolus administration of
intravenous contrast.
CONTRAST:  100mL OMNIPAQUE IOHEXOL 300 MG/ML  SOLN

[Series 2: axial st · axial · 0.89mm/px · z∈[-407,+48]mm · 13 of 103 slices shown, 15 images]
[im 6/103  soft-tissue]
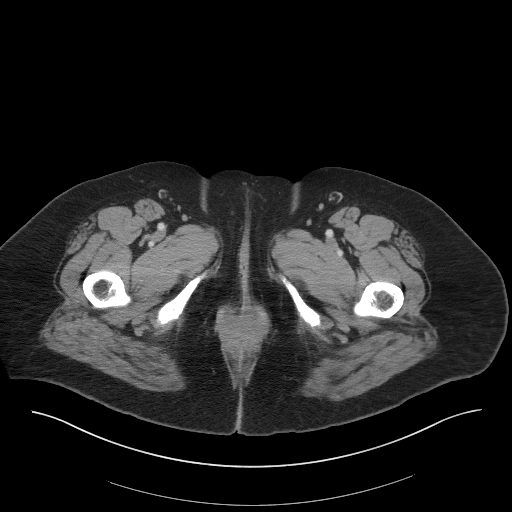
[im 6/103  bone]
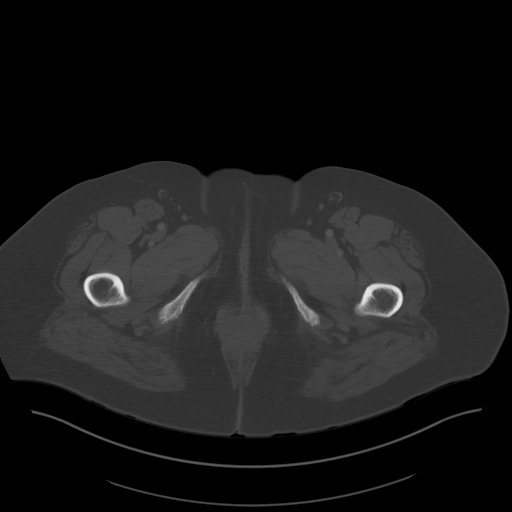
[im 17/103  soft-tissue]
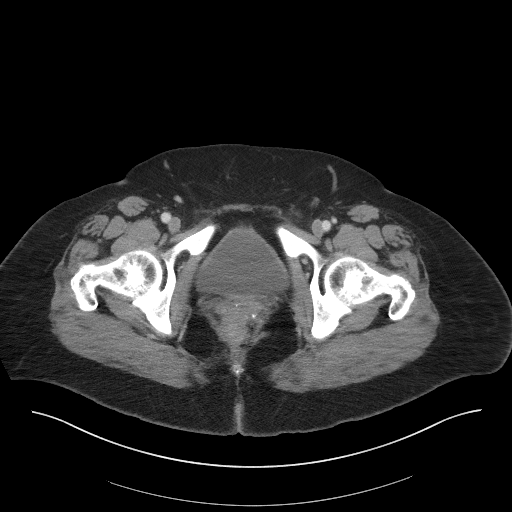
[im 22/103  soft-tissue]
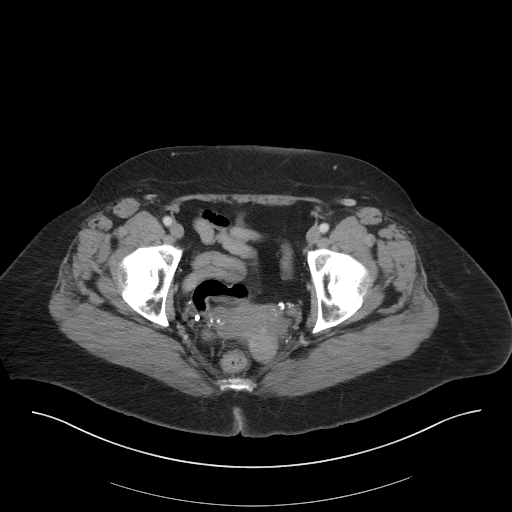
[im 27/103  soft-tissue]
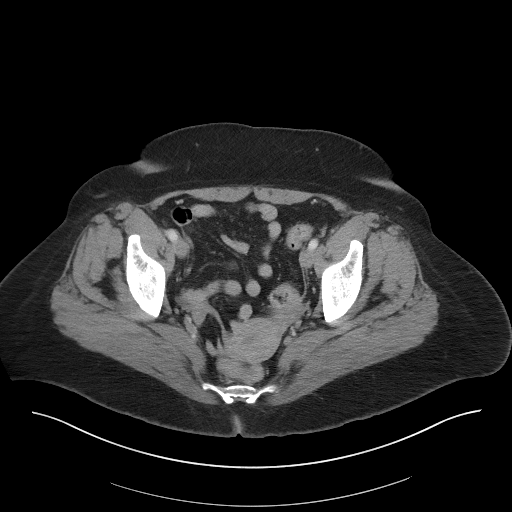
[im 38/103  soft-tissue]
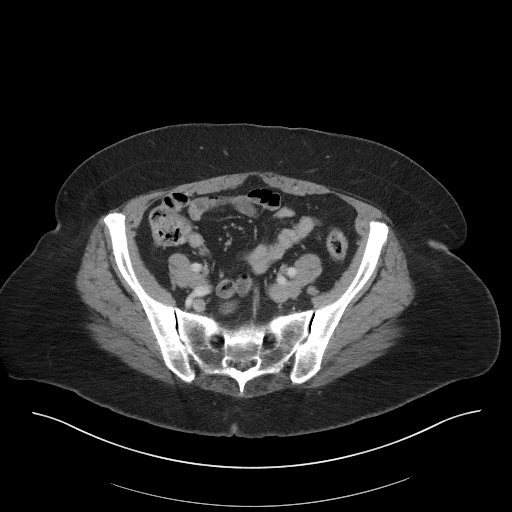
[im 43/103  soft-tissue]
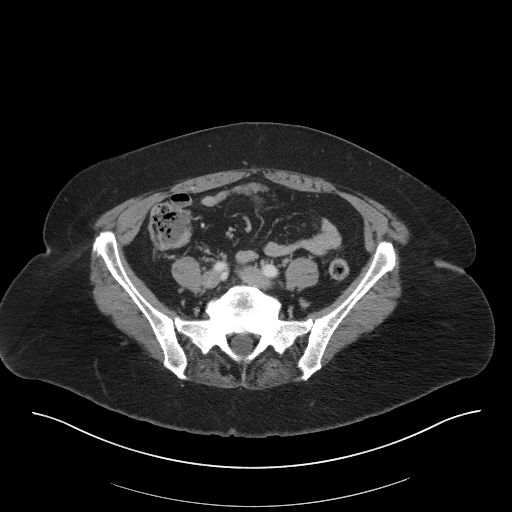
[im 54/103  soft-tissue]
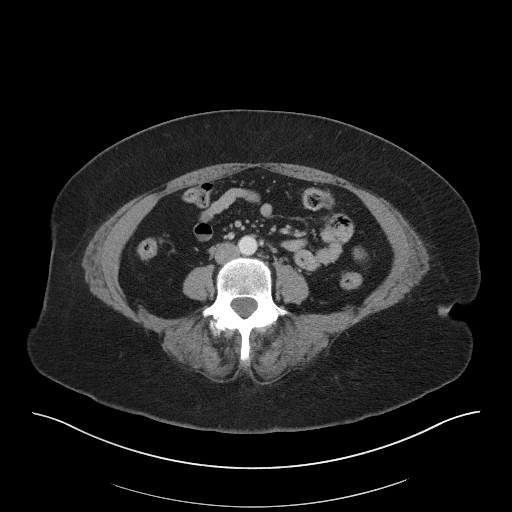
[im 60/103  soft-tissue]
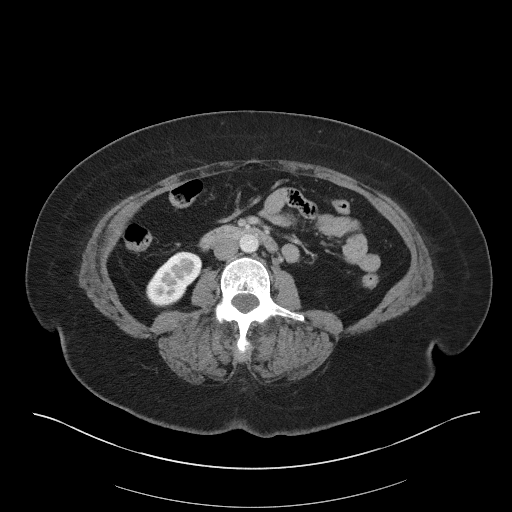
[im 65/103  soft-tissue]
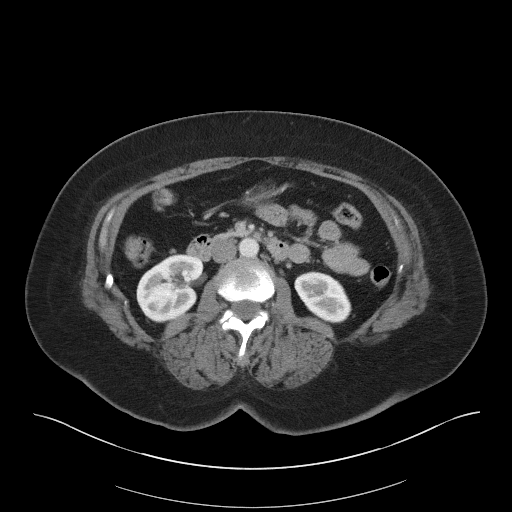
[im 65/103  bone]
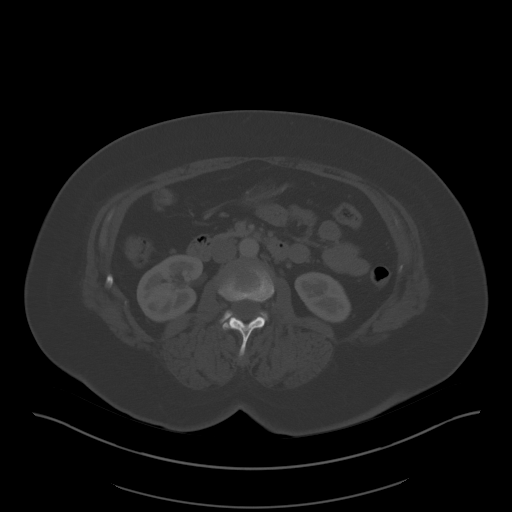
[im 76/103  soft-tissue]
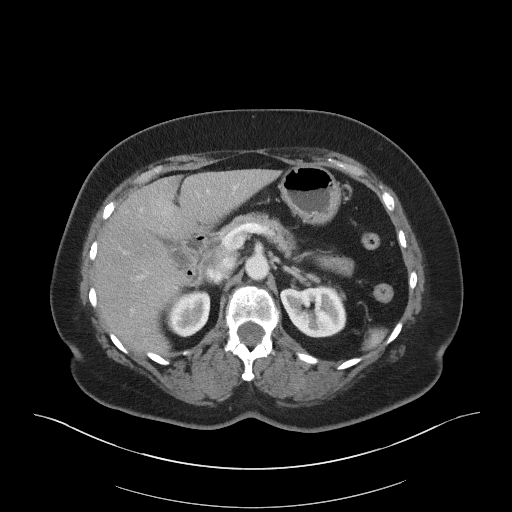
[im 81/103  soft-tissue]
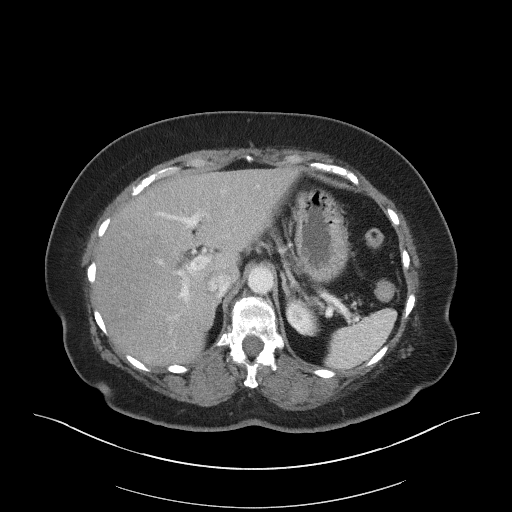
[im 86/103  soft-tissue]
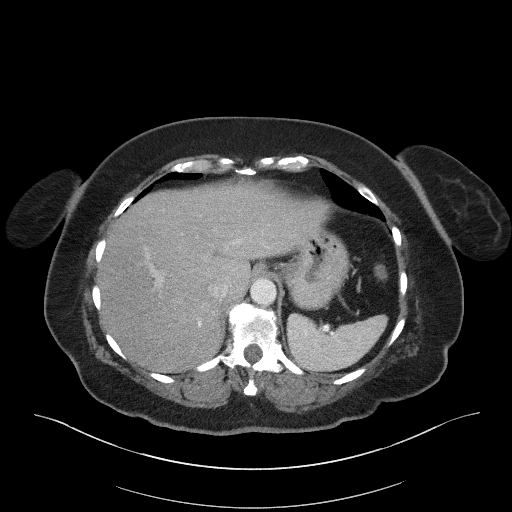
[im 97/103  soft-tissue]
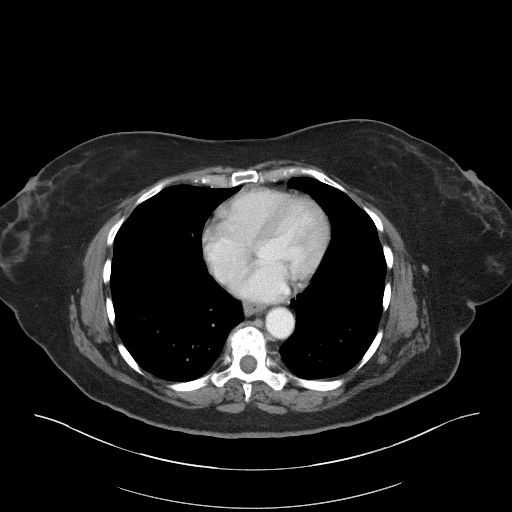

[Series 5: coronal st · coronal · 0.89mm/px · 3 of 101 slices shown]
[im 34/101  soft-tissue]
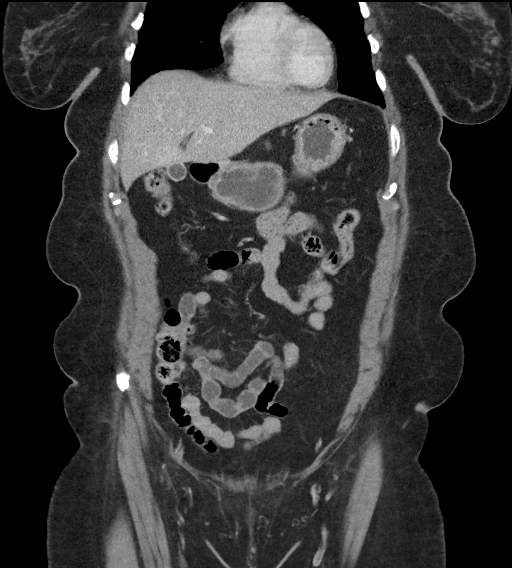
[im 45/101  soft-tissue]
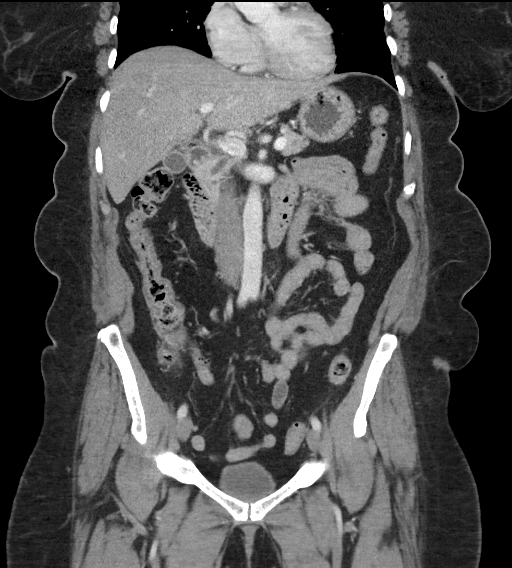
[im 56/101  soft-tissue]
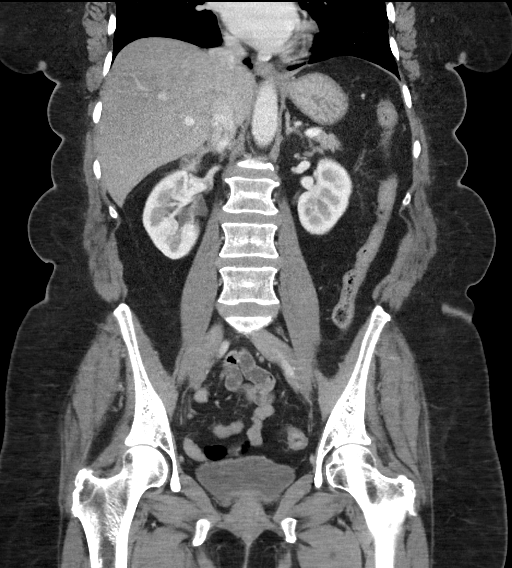

[16 of 46 positions shown; findings below may reference images not displayed]

FINDINGS: Lower chest: No acute abnormality.

Hepatobiliary: No focal liver abnormality is seen. No gallstones,
gallbladder wall thickening, or biliary dilatation.

Pancreas: Unremarkable. No pancreatic ductal dilatation or
surrounding inflammatory changes.

Spleen: Normal in size without focal abnormality.

Adrenals/Urinary Tract: 2 cm probable cyst, upper pole right kidney.
3 mm calculus, lower pole left renal collecting system. No
hydronephrosis. Urinary bladder incompletely distended. Adrenal
glands unremarkable.

Stomach/Bowel: Stomach is nondistended. Small bowel decompressed.
Normal appendix. The colon is nondilated, unremarkable.

Vascular/Lymphatic: Minimal aortoiliac calcified plaque without
aneurysm or evident stenosis. No abdominal or pelvic adenopathy.

Reproductive: Uterus and bilateral adnexa are unremarkable.

Other: Bilateral pelvic phleboliths.  No ascites.  No free air.

Musculoskeletal: Small paraumbilical hernia containing only
mesenteric fat. Degenerative disc disease L5-S1. Negative for
fracture or worrisome bone lesion.
IMPRESSION: 1. No acute findings.
2. Left nephrolithiasis without hydronephrosis.
3. Small paraumbilical hernia containing only mesenteric fat.

Aortic Atherosclerosis ([id number]-[id number].T).

## 2022-02-26 ENCOUNTER — Emergency Department (HOSPITAL_BASED_OUTPATIENT_CLINIC_OR_DEPARTMENT_OTHER): Payer: Medicare Other

## 2022-02-26 ENCOUNTER — Encounter (HOSPITAL_BASED_OUTPATIENT_CLINIC_OR_DEPARTMENT_OTHER): Payer: Self-pay | Admitting: Emergency Medicine

## 2022-02-26 ENCOUNTER — Emergency Department (HOSPITAL_BASED_OUTPATIENT_CLINIC_OR_DEPARTMENT_OTHER)
Admission: EM | Admit: 2022-02-26 | Discharge: 2022-02-26 | Disposition: A | Discharge status: Home / Self Care | Payer: Medicare Other | Attending: Emergency Medicine | Admitting: Emergency Medicine

## 2022-02-26 ENCOUNTER — Other Ambulatory Visit: Payer: Self-pay

## 2022-02-26 DIAGNOSIS — F17210 Nicotine dependence, cigarettes, uncomplicated: Secondary | ICD-10-CM | POA: Insufficient documentation

## 2022-02-26 DIAGNOSIS — I10 Essential (primary) hypertension: Secondary | ICD-10-CM | POA: Insufficient documentation

## 2022-02-26 DIAGNOSIS — N281 Cyst of kidney, acquired: Secondary | ICD-10-CM | POA: Diagnosis not present

## 2022-02-26 DIAGNOSIS — K760 Fatty (change of) liver, not elsewhere classified: Secondary | ICD-10-CM | POA: Diagnosis not present

## 2022-02-26 DIAGNOSIS — R1032 Left lower quadrant pain: Secondary | ICD-10-CM

## 2022-02-26 DIAGNOSIS — R109 Unspecified abdominal pain: Secondary | ICD-10-CM | POA: Diagnosis present

## 2022-02-26 LAB — CBC
HCT: 45.3 % (ref 36.0–46.0)
Hemoglobin: 15 g/dL (ref 12.0–15.0)
MCH: 29.2 pg (ref 26.0–34.0)
MCHC: 33.1 g/dL (ref 30.0–36.0)
MCV: 88.1 fL (ref 80.0–100.0)
Platelets: 208 10*3/uL (ref 150–400)
RBC: 5.14 MIL/uL — ABNORMAL HIGH (ref 3.87–5.11)
RDW: 14.1 % (ref 11.5–15.5)
WBC: 4.8 10*3/uL (ref 4.0–10.5)
nRBC: 0 % (ref 0.0–0.2)

## 2022-02-26 LAB — URINALYSIS, ROUTINE W REFLEX MICROSCOPIC
Bilirubin Urine: NEGATIVE
Glucose, UA: >=500 mg/dL — AB
Hgb urine dipstick: NEGATIVE
Ketones, ur: NEGATIVE mg/dL
Leukocytes,Ua: NEGATIVE
Nitrite: NEGATIVE
Protein, ur: NEGATIVE mg/dL
Specific Gravity, Urine: 1.02 (ref 1.005–1.030)
pH: 5.5 (ref 5.0–8.0)

## 2022-02-26 LAB — URINALYSIS, MICROSCOPIC (REFLEX)

## 2022-02-26 LAB — COMPREHENSIVE METABOLIC PANEL
ALT: 24 U/L (ref 0–44)
AST: 20 U/L (ref 15–41)
Albumin: 4.1 g/dL (ref 3.5–5.0)
Alkaline Phosphatase: 56 U/L (ref 38–126)
Anion gap: 7 (ref 5–15)
BUN: 13 mg/dL (ref 8–23)
CO2: 25 mmol/L (ref 22–32)
Calcium: 8.7 mg/dL — ABNORMAL LOW (ref 8.9–10.3)
Chloride: 107 mmol/L (ref 98–111)
Creatinine, Ser: 0.98 mg/dL (ref 0.44–1.00)
GFR, Estimated: >60 mL/min (ref >60)
Glucose, Bld: 94 mg/dL (ref 70–99)
Potassium: 3.5 mmol/L (ref 3.5–5.1)
Sodium: 139 mmol/L (ref 135–145)
Total Bilirubin: 0.6 mg/dL (ref 0.3–1.2)
Total Protein: 7.5 g/dL (ref 6.5–8.1)

## 2022-02-26 LAB — LIPASE, BLOOD: Lipase: 27 U/L (ref 11–51)

## 2022-02-26 MED ORDER — ONDANSETRON HCL 4 MG PO TABS: 4.0000 mg | ORAL_TABLET | ORAL | 0 refills | Status: AC | PRN

## 2022-02-26 MED ORDER — DIPHENHYDRAMINE HCL 50 MG/ML IJ SOLN
12.5000 mg | Freq: Once | INTRAMUSCULAR | Status: AC
  Administered 2022-02-26: 12.5 mg via INTRAVENOUS
  Filled 2022-02-26: qty 1

## 2022-02-26 MED ORDER — IOHEXOL 300 MG/ML  SOLN
100.0000 mL | Freq: Once | INTRAMUSCULAR | Status: AC | PRN
  Administered 2022-02-26: 100 mL via INTRAVENOUS

## 2022-02-26 MED ORDER — ONDANSETRON HCL 4 MG/2ML IJ SOLN
4.0000 mg | Freq: Once | INTRAMUSCULAR | Status: AC | PRN
  Administered 2022-02-26: 4 mg via INTRAVENOUS
  Filled 2022-02-26: qty 2

## 2022-02-26 MED ORDER — HYDROMORPHONE HCL 1 MG/ML IJ SOLN
1.0000 mg | Freq: Once | INTRAMUSCULAR | Status: AC
  Administered 2022-02-26: 1 mg via INTRAVENOUS
  Filled 2022-02-26: qty 1

## 2022-02-26 MED ORDER — SODIUM CHLORIDE 0.9 % IV BOLUS
1000.0000 mL | Freq: Once | INTRAVENOUS | Status: AC
  Administered 2022-02-26: 1000 mL via INTRAVENOUS

## 2022-02-26 NOTE — Discharge Instructions (Addendum)
Emergency care providers appreciate that many patients coming to us are in severe pain and we wish to address their pain in the safest, most responsible manner.  It is important to recognize, however, that the proper treatment of chronic pain differs from that of the pain of injuries and acute illnesses.  Our goal is to provider quality, safe, personalized care and we thank you for giving us the opportunity to serve you.  The use of narcotics and related agents for chronic pain syndromes may lead to additional physical and psychological problems.  Nearly as many people die from prescription narcotics each year as die from car crashes.  Additionally, this risk is increased if such prescriptions are obtained from a variety of sources.  Therefore, only your primary care physician or a pain management specialist is able to safely treat such syndromes with narcotic medications long-term.  Documentation revealing such prescriptions have been sought from multiple sources may prohibit us from providing a refill or different narcotic medication.  Your name may be checked first through the [place] Controlled Substances Reporting System.  This database is a record of controlled substance medication prescriptions that the patient has received.  This has been established by [facility] in an effort to eliminate the dangerous, and often life-threatening, practice of obtaining multiple prescriptions from different medical providers.  If you have a chronic pain syndrome (i.e. chronic headaches, recurrent back or neck pain, dental pain, abdominal or pelvic pain without a specific diagnosis, or neuropathic pain such as fibromyalgia) or recurrent visits for the same condition without an acute diagnosis, you may be treated with non-narcotics and other non-addictive medicines.  Allergic reactions or negative side effects that may be reported by a patient to such medications will not typically lead to the use of a  narcotic analgesic or other controlled substance as an alternative.  Patients managing chronic pain with a personal physician should have provisions in place for breakthrough pain.  If you are in crisis, you should call your physician.  If your physician directs you to the emergency department, please have the doctor call and speak to our attending physician concerning your care.  When patients come to the Emergency Department (ED) with acute medical conditions in which the ED physician feels it is appropriate to prescribe narcotic or sedating pain medication, the physician will prescribe these in very limited quantities.  The amount of these medications will last only until you can see your primary care physician in his/her office.  Any patient who returns to the ED seeking refills should expect only non-narcotic pain medications.  In the event an acute medical condition exists and the emergency physician feels it is necessary that the patient be given a narcotic or sedating medication, a responsible adult driver should be present in the room prior to the medication being given by the nurse.  Prescriptions for narcotic or sedating medications that have been lost, stolen, or expired will NOT be refilled in the ED.  Patients who have chronic pain may receive non-narcotic prescriptions until seen by their primary care physician.  It is every patient's personal responsibility to maintain active prescriptions with his or her primary care physician or specialist.  

## 2022-02-26 NOTE — ED Notes (Signed)
Hot packs for abdominal pain

## 2022-02-26 NOTE — ED Provider Notes (Signed)
[facility] EMERGENCY DEPARTMENT Provider Note   CSN: [id number] Arrival date & time: 02/26/22  1410     History  Chief Complaint  Patient presents with   Abdominal Pain    Cheryl Stark is a 66 y.o. female.  Patient as above with significant medical history as below, including chronic abdominal pain, chronic opiate use, hypertension [place] HLD, DM, DDD, depression who presents to the ED with complaint of LLQ abd pain. Pt reports she ran out of her percocet 3 days ago, since then has had worsening of her chronic abd pain. She is due to see her PA on Thursday for a refill of her percocet. Pt was seen by OBGYN for similar pain in the past, dx with fibroid. Multiple ED visits for similar complaint. She feels her symptoms have not changed in character but are more severe than they were while she was taking her opiate. She also reports worsening anxiety and feels as though she may be about to have a panic attack 2/2 running out of percocet. She has no vomiting but having some nausea, no fevers or chills, no change to bowel/bladder fxn. NO cp or dib. No rashes or trauma. Pain is constant, unremitting, no alleviating or exacerbating factors identified.  Pt requests "knock me out good."     Past Medical History:  Diagnosis Date   Anxiety    Chronic back pain    DDD (degenerative disc disease), cervical    Depression    Diabetes mellitus without complication (HCC)    High cholesterol    Hypertension    Rheumatoid arthritis(714.0)     Past Surgical History:  Procedure Laterality Date   CERVICAL DISC SURGERY     disc removed       The history is provided by the patient. No language interpreter was used.  Abdominal Pain Associated symptoms: nausea   Associated symptoms: no chest pain, no cough, no dysuria, no fever, no shortness of breath and no vomiting        Home Medications Prior to Admission medications   Medication Sig Start Date End Date Taking? Authorizing  Provider  ondansetron (ZOFRAN) 4 MG tablet Take 1 tablet (4 mg total) by mouth every 4 (four) hours as needed for nausea or vomiting. 02/26/22  Yes Sloan Leiter, DO  ACCU-CHEK AVIVA PLUS test strip USE UTD 05/01/15   [provider]  ALPRAZolam Prudy Feeler) 0.5 MG tablet Take 1 tablet (0.5 mg total) by mouth at bedtime as needed for anxiety. 01/23/20   Benjiman Core, MD  atorvastatin (LIPITOR) 40 MG tablet Take 40 mg by mouth every evening.    [provider]  fluticasone (FLONASE) 50 MCG/ACT nasal spray Place 1-2 sprays into both nostrils daily as needed for allergies or rhinitis.    [provider]  losartan (COZAAR) 25 MG tablet Take 25 mg by mouth daily.    [provider]  omeprazole (PRILOSEC) 40 MG capsule Take 40 mg by mouth daily.    [provider]  oxyCODONE-acetaminophen (PERCOCET) 10-325 MG tablet Take 1 tablet by mouth every 4 (four) hours as needed for pain.    [provider]  pregabalin (LYRICA) 225 MG capsule Take 225 mg by mouth 2 (two) times daily.    [provider]      Allergies    Hydrocodone, Ramelteon, and Tramadol    Review of Systems   Review of Systems  Constitutional:  Negative for activity change and fever.  HENT:  Negative  for facial swelling and trouble swallowing.   Eyes:  Negative for discharge and redness.  Respiratory:  Negative for cough and shortness of breath.   Cardiovascular:  Negative for chest pain and palpitations.  Gastrointestinal:  Positive for abdominal pain and nausea. Negative for vomiting.  Genitourinary:  Negative for dysuria and flank pain.  Musculoskeletal:  Negative for back pain and gait problem.  Skin:  Negative for pallor and rash.  Neurological:  Negative for syncope and headaches.    Physical Exam Updated Vital Signs BP (!) 154/89 (BP Location: Right Arm)   Pulse 61   Temp 98.4 F (36.9 C)   Resp (!) 22   Ht 5\' 9"  (1.753 m)   Wt 108.2 kg   SpO2 98%   BMI  35.24 kg/m  Physical Exam Vitals and nursing note reviewed.  Constitutional:      General: She is not in acute distress.    Appearance: Normal appearance. She is well-developed. She is obese. She is not ill-appearing or diaphoretic.  HENT:     Head: Normocephalic and atraumatic.     Right Ear: External ear normal.     Left Ear: External ear normal.     Nose: Nose normal.     Mouth/Throat:     Mouth: Mucous membranes are moist.  Eyes:     General: No scleral icterus.       Right eye: No discharge.        Left eye: No discharge.  Cardiovascular:     Rate and Rhythm: Normal rate and regular rhythm.     Pulses: Normal pulses.     Heart sounds: Normal heart sounds.  Pulmonary:     Effort: Pulmonary effort is normal. No respiratory distress.     Breath sounds: Normal breath sounds.  Abdominal:     General: Abdomen is flat.     Palpations: Abdomen is soft.     Tenderness: There is abdominal tenderness in the left lower quadrant. There is no guarding or rebound.  Musculoskeletal:        General: Normal range of motion.     Cervical back: Normal range of motion.     Right lower leg: No edema.     Left lower leg: No edema.  Skin:    General: Skin is warm and dry.     Capillary Refill: Capillary refill takes less than 2 seconds.  Neurological:     Mental Status: She is alert and oriented to person, place, and time.     GCS: GCS eye subscore is 4. GCS verbal subscore is 5. GCS motor subscore is 6.  Psychiatric:        Mood and Affect: Mood normal.        Behavior: Behavior normal.     ED Results / Procedures / Treatments   Labs (all labs ordered are listed, but only abnormal results are displayed) Labs Reviewed  COMPREHENSIVE METABOLIC PANEL - Abnormal; Notable for the following components:      Result Value   Calcium 8.7 (*)    All other components within normal limits  CBC - Abnormal; Notable for the following components:   RBC 5.14 (*)    All other components within  normal limits  URINALYSIS, ROUTINE W REFLEX MICROSCOPIC - Abnormal; Notable for the following components:   Glucose, UA >=500 (*)    All other components within normal limits  URINALYSIS, MICROSCOPIC (REFLEX) - Abnormal; Notable for the following components:   Bacteria, UA RARE (*)  All other components within normal limits  LIPASE, BLOOD    EKG EKG Interpretation  Date/Time:  Tuesday February 26 2022 14:33:49 EDT Ventricular Rate:  72 PR Interval:  140 QRS Duration: 104 QT Interval:  398 QTC Calculation: 436 R Axis:   -1 Text Interpretation: Sinus rhythm Abnormal R-wave progression, early transition similar to prior no stemi Confirmed by Tanda Rockers ([id number]) on 02/26/2022 4:31:05 PM  Radiology CT ABDOMEN PELVIS W CONTRAST  Result Date: 02/26/2022 CLINICAL DATA:  Acute abdominal pain. EXAM: CT ABDOMEN AND PELVIS WITH CONTRAST TECHNIQUE: Multidetector CT imaging of the abdomen and pelvis was performed using the standard protocol following bolus administration of intravenous contrast. RADIATION DOSE REDUCTION: This exam was performed according to the departmental dose-optimization program which includes automated exposure control, adjustment of the mA and/or kV according to patient size and/or use of iterative reconstruction technique. CONTRAST:  OMNIPAQUE IOHEXOL 300 MG/ML  SOLN COMPARISON:  CT abdomen and pelvis 01/23/2020 FINDINGS: Lower chest: No acute abnormality. Hepatobiliary: There is diffuse fatty infiltration of the liver. Gallbladder and bile ducts are within normal limits. Pancreas: Unremarkable. No pancreatic ductal dilatation or surrounding inflammatory changes. Spleen: Normal in size without focal abnormality. Adrenals/Urinary Tract: There is some punctate nonobstructing left renal calculi. There is no hydronephrosis or perinephric fat stranding. There is a 2.3 cm cyst in the right kidney. The adrenal glands and bladder are within normal limits. Stomach/Bowel: Stomach is  within normal limits. Appendix appears normal. No evidence of bowel wall thickening, distention, or inflammatory changes. Sigmoid colon diverticula are present. Vascular/Lymphatic: No significant vascular findings are present. No enlarged abdominal or pelvic lymph nodes. Reproductive: Heterogeneous is lobulated posteriorly and contains multiple calcifications likely related to fibroid change. The adnexa are within normal limits. Other: No abdominal wall hernia or abnormality. No abdominopelvic ascites. Musculoskeletal: Degenerative changes affect the spine. IMPRESSION: 1. No acute localizing process in the abdomen or pelvis. 2. Fatty infiltration of the liver. 3. Sigmoid colon diverticulosis. 4. 2.3 cm right Bosniak 1 benign simple cyst. No follow-up imaging is recommended. JACR 2018 Feb; 264-273, Management of the Incidental RenalMass on CT, [employer] 2021; [id number], Bosniak Classification of Cystic Renal Masses, Version 2019. Electronically Signed   By: Darliss Cheney M.D.   On: 02/26/2022 16:21    Procedures Procedures    Medications Ordered in ED Medications  ondansetron (ZOFRAN) injection 4 mg (4 mg Intravenous Given 02/26/22 1515)  sodium chloride 0.9 % bolus 1,000 mL (0 mLs Intravenous Stopped 02/26/22 1705)  iohexol (OMNIPAQUE) 300 MG/ML solution 100 mL (100 mLs Intravenous Contrast Given 02/26/22 1545)  HYDROmorphone (DILAUDID) injection 1 mg (1 mg Intravenous Given 02/26/22 1702)  diphenhydrAMINE (BENADRYL) injection 12.5 mg (12.5 mg Intravenous Given 02/26/22 1659)    ED Course/ Medical Decision Making/ A&P                           Medical Decision Making Risk Prescription drug management.   This patient presents to the ED with chief complaint(s) of abd pain llq with pertinent past medical history of chronic abd pain, chronic opiate use which further complicates the presenting complaint. The complaint involves an extensive differential diagnosis and also carries with it a high risk of  complications and morbidity.    Differential diagnosis includes but is not exclusive to ectopic pregnancy, ovarian cyst, ovarian torsion, acute appendicitis, urinary tract infection, endometriosis, bowel obstruction, hernia, colitis, renal colic, gastroenteritis, volvulus etc.  . Serious etiologies were considered.  The initial plan is to screening labs/imaging/ivf/analgesic   Additional history obtained: Additional history obtained from spouse Records reviewed Primary Care Documents and prior ed visits/ PDMP  Independent labs interpretation:  The following labs were independently interpreted:  UA w/o UTI CMP and CBC are stable  Independent visualization of imaging: - I independently visualized the following imaging with scope of interpretation limited to determining acute life threatening conditions related to emergency care: CTAP, which revealed fatty liver, simple cyst, no acute process  Cardiac monitoring was reviewed and interpreted by myself which shows n/a  Treatment and Reassessment: Ivf Analgesics Zofran >> symptoms improved   Consultation: - Consulted or discussed management/test interpretation w/ external professional: n/a  Consideration for admission or further workup: Admission was considered   Pt with chronic abdominal pain, does not appear to be an acute condition present given re-assurring labs and imaging today. Pt ran out of her percocet and is due to see her PA on Thursday for a refill. Given analgesic dose here, advised pt regarding chronic pain policy. Advised her to f/u with her specialist regarding her narcotic refill.   The patient improved significantly and was discharged in stable condition. Detailed discussions were had with the patient regarding current findings, and need for close f/u with PCP or on call doctor. The patient has been instructed to return immediately if the symptoms worsen in any way for re-evaluation. Patient verbalized understanding  and is in agreement with current care plan. All questions answered prior to discharge.    Social Determinants of health: Social History   Tobacco Use   Smoking status: Every Day    Packs/day: 0.25    Types: Cigarettes   Smokeless tobacco: Never  Substance Use Topics   Alcohol use: No    Alcohol/week: 0.0 standard drinks of alcohol   Drug use: No            Final Clinical Impression(s) / ED Diagnoses Final diagnoses:  Left lower quadrant abdominal pain  Renal cyst    Rx / DC Orders ED Discharge Orders          Ordered    ondansetron (ZOFRAN) 4 MG tablet  Every 4 hours PRN        02/26/22 1717              Sloan Leiter, DO 02/26/22 1723

## 2022-02-26 NOTE — ED Notes (Signed)
Family at bedside. 

## 2022-02-26 NOTE — ED Triage Notes (Signed)
LLQ abdominal pain for weeks.  Pt states she has nausea, fatigue, and severe pain.  Unable to sleep.  Pt denies fever or diarrhea.  Pt being treated for ovarian cyst but states the pain just wont go away and she cannot tolerate anything PO

## 2023-03-02 ENCOUNTER — Emergency Department (HOSPITAL_BASED_OUTPATIENT_CLINIC_OR_DEPARTMENT_OTHER): Payer: Medicare Other

## 2023-03-02 ENCOUNTER — Emergency Department (HOSPITAL_BASED_OUTPATIENT_CLINIC_OR_DEPARTMENT_OTHER)
Admission: EM | Admit: 2023-03-02 | Discharge: 2023-03-02 | Disposition: A | Discharge status: Home / Self Care | Payer: Medicare Other | Attending: Emergency Medicine | Admitting: Emergency Medicine

## 2023-03-02 ENCOUNTER — Encounter (HOSPITAL_BASED_OUTPATIENT_CLINIC_OR_DEPARTMENT_OTHER): Payer: Self-pay | Admitting: Emergency Medicine

## 2023-03-02 DIAGNOSIS — K5730 Diverticulosis of large intestine without perforation or abscess without bleeding: Secondary | ICD-10-CM | POA: Insufficient documentation

## 2023-03-02 DIAGNOSIS — D259 Leiomyoma of uterus, unspecified: Secondary | ICD-10-CM | POA: Insufficient documentation

## 2023-03-02 DIAGNOSIS — Z87442 Personal history of urinary calculi: Secondary | ICD-10-CM | POA: Diagnosis not present

## 2023-03-02 DIAGNOSIS — F17210 Nicotine dependence, cigarettes, uncomplicated: Secondary | ICD-10-CM | POA: Diagnosis not present

## 2023-03-02 DIAGNOSIS — Z79899 Other long term (current) drug therapy: Secondary | ICD-10-CM | POA: Insufficient documentation

## 2023-03-02 DIAGNOSIS — R531 Weakness: Secondary | ICD-10-CM | POA: Diagnosis not present

## 2023-03-02 DIAGNOSIS — E119 Type 2 diabetes mellitus without complications: Secondary | ICD-10-CM | POA: Diagnosis not present

## 2023-03-02 DIAGNOSIS — K760 Fatty (change of) liver, not elsewhere classified: Secondary | ICD-10-CM | POA: Insufficient documentation

## 2023-03-02 DIAGNOSIS — R110 Nausea: Secondary | ICD-10-CM | POA: Diagnosis present

## 2023-03-02 DIAGNOSIS — R5383 Other fatigue: Secondary | ICD-10-CM | POA: Insufficient documentation

## 2023-03-02 DIAGNOSIS — I700 Atherosclerosis of aorta: Secondary | ICD-10-CM | POA: Insufficient documentation

## 2023-03-02 DIAGNOSIS — I10 Essential (primary) hypertension: Secondary | ICD-10-CM | POA: Diagnosis not present

## 2023-03-02 DIAGNOSIS — N200 Calculus of kidney: Secondary | ICD-10-CM | POA: Diagnosis not present

## 2023-03-02 LAB — URINALYSIS, MICROSCOPIC (REFLEX)
RBC / HPF: NONE SEEN RBC/hpf (ref 0–5)
WBC, UA: NONE SEEN WBC/hpf (ref 0–5)

## 2023-03-02 LAB — URINALYSIS, ROUTINE W REFLEX MICROSCOPIC
Bilirubin Urine: NEGATIVE
Glucose, UA: >=500 mg/dL — AB
Hgb urine dipstick: NEGATIVE
Ketones, ur: NEGATIVE mg/dL
Leukocytes,Ua: NEGATIVE
Nitrite: NEGATIVE
Protein, ur: NEGATIVE mg/dL
Specific Gravity, Urine: 1.02 (ref 1.005–1.030)
pH: 6 (ref 5.0–8.0)

## 2023-03-02 LAB — COMPREHENSIVE METABOLIC PANEL
ALT: 13 U/L (ref 0–44)
AST: 15 U/L (ref 15–41)
Albumin: 4 g/dL (ref 3.5–5.0)
Alkaline Phosphatase: 61 U/L (ref 38–126)
Anion gap: 8 (ref 5–15)
BUN: 12 mg/dL (ref 8–23)
CO2: 26 mmol/L (ref 22–32)
Calcium: 8.6 mg/dL — ABNORMAL LOW (ref 8.9–10.3)
Chloride: 105 mmol/L (ref 98–111)
Creatinine, Ser: 0.9 mg/dL (ref 0.44–1.00)
GFR, Estimated: >60 mL/min (ref >60)
Glucose, Bld: 106 mg/dL — ABNORMAL HIGH (ref 70–99)
Potassium: 3.6 mmol/L (ref 3.5–5.1)
Sodium: 139 mmol/L (ref 135–145)
Total Bilirubin: 0.4 mg/dL (ref 0.3–1.2)
Total Protein: 7.5 g/dL (ref 6.5–8.1)

## 2023-03-02 LAB — CBC WITH DIFFERENTIAL/PLATELET
Abs Immature Granulocytes: 0 10*3/uL (ref 0.00–0.07)
Basophils Absolute: 0 10*3/uL (ref 0.0–0.1)
Basophils Relative: 1 %
Eosinophils Absolute: 0.1 10*3/uL (ref 0.0–0.5)
Eosinophils Relative: 2 %
HCT: 46.5 % — ABNORMAL HIGH (ref 36.0–46.0)
Hemoglobin: 15.3 g/dL — ABNORMAL HIGH (ref 12.0–15.0)
Immature Granulocytes: 0 %
Lymphocytes Relative: 46 %
Lymphs Abs: 1.9 10*3/uL (ref 0.7–4.0)
MCH: 28.9 pg (ref 26.0–34.0)
MCHC: 32.9 g/dL (ref 30.0–36.0)
MCV: 87.9 fL (ref 80.0–100.0)
Monocytes Absolute: 0.3 10*3/uL (ref 0.1–1.0)
Monocytes Relative: 6 %
Neutro Abs: 1.8 10*3/uL (ref 1.7–7.7)
Neutrophils Relative %: 45 %
Platelets: 212 10*3/uL (ref 150–400)
RBC: 5.29 MIL/uL — ABNORMAL HIGH (ref 3.87–5.11)
RDW: 13.8 % (ref 11.5–15.5)
WBC: 4 10*3/uL (ref 4.0–10.5)
nRBC: 0 % (ref 0.0–0.2)

## 2023-03-02 MED ORDER — OXYCODONE-ACETAMINOPHEN 5-325 MG PO TABS
1.0000 | ORAL_TABLET | Freq: Once | ORAL | Status: AC
  Administered 2023-03-02: 1 via ORAL
  Filled 2023-03-02: qty 1

## 2023-03-02 MED ORDER — SODIUM CHLORIDE 0.9 % IV BOLUS
1000.0000 mL | Freq: Once | INTRAVENOUS | Status: AC
  Administered 2023-03-02: 1000 mL via INTRAVENOUS

## 2023-03-02 MED ORDER — ONDANSETRON HCL 4 MG/2ML IJ SOLN
4.0000 mg | Freq: Once | INTRAMUSCULAR | Status: AC
  Administered 2023-03-02: 4 mg via INTRAVENOUS
  Filled 2023-03-02: qty 2

## 2023-03-02 MED ORDER — SODIUM CHLORIDE 0.9% FLUSH
3.0000 mL | Freq: Once | INTRAVENOUS | Status: AC
  Administered 2023-03-02: 3 mL via INTRAVENOUS
  Filled 2023-03-02: qty 3

## 2023-03-02 MED ORDER — ONDANSETRON 4 MG PO TBDP: 4.0000 mg | ORAL_TABLET | ORAL | 0 refills | Status: AC | PRN

## 2023-03-02 NOTE — Discharge Instructions (Addendum)
1.  Take Zofran every 4 hours as needed for nausea. 2.  Return if you have new worsening or concerning symptoms. 3.  Proceed with all the follow-up appointments that you have scheduled for further evaluation of your symptoms.

## 2023-03-02 NOTE — ED Provider Notes (Signed)
[place] [facility] AT [facility] Provider Note  CSN: [id number] Arrival date & time: 03/02/23 1306  Chief Complaint(s) Fatigue  HPI Cheryl Stark is a 67 y.o. female history of rheumatoid arthritis, diabetes, prior kidney stone presenting to the emergency department with fatigue.  Patient reports 3 to 4 weeks of nausea, fatigue.  Reports today felt lightheaded while in the shower, did not have syncope.  Reports today also developed significant left-sided back pain which is somewhat similar to chronic back pain but also somewhat similar to kidney stone.  No urinary symptoms, vomiting, fevers or chills, abdominal pain she reports the fatigue comes and goes.  She saw her primary doctor who told her she was dehydrated.  She reports she is here both for the back pain and fatigue.  No numbness or tingling, trouble walking, incontinence.  Past Medical History Past Medical History:  Diagnosis Date   Anxiety    Chronic back pain    DDD (degenerative disc disease), cervical    Depression    Diabetes mellitus without complication (HCC)    High cholesterol    Hypertension    Rheumatoid arthritis(714.0)    Patient Active Problem List   Diagnosis Date Noted   Keratoma 05/30/2015   Pain in lower limb 05/30/2015   Diabetic neuropathy associated with diabetes mellitus due to underlying condition (HCC) 05/30/2015   Chronic back pain 11/26/2012   Right shoulder pain 11/26/2012   Home Medication(s) Prior to Admission medications   Medication Sig Start Date End Date Taking? Authorizing Provider  ondansetron (ZOFRAN-ODT) 4 MG disintegrating tablet Take 1 tablet (4 mg total) by mouth every 4 (four) hours as needed. 03/02/23  Yes Arby Barrette, MD  ACCU-CHEK AVIVA PLUS test strip USE UTD 05/01/15   [provider]  ALPRAZolam Prudy Feeler) 0.5 MG tablet Take 1 tablet (0.5 mg total) by mouth at bedtime as needed for anxiety. 01/23/20   Benjiman Core, MD  atorvastatin  (LIPITOR) 40 MG tablet Take 40 mg by mouth every evening.    [provider]  fluticasone (FLONASE) 50 MCG/ACT nasal spray Place 1-2 sprays into both nostrils daily as needed for allergies or rhinitis.    [provider]  losartan (COZAAR) 25 MG tablet Take 25 mg by mouth daily.    [provider]  omeprazole (PRILOSEC) 40 MG capsule Take 40 mg by mouth daily.    [provider]  ondansetron (ZOFRAN) 4 MG tablet Take 1 tablet (4 mg total) by mouth every 4 (four) hours as needed for nausea or vomiting. 02/26/22   Sloan Leiter, DO  oxyCODONE-acetaminophen (PERCOCET) 10-325 MG tablet Take 1 tablet by mouth every 4 (four) hours as needed for pain.    [provider]  pregabalin (LYRICA) 225 MG capsule Take 225 mg by mouth 2 (two) times daily.    [provider]  Past Surgical History Past Surgical History:  Procedure Laterality Date   CERVICAL DISC SURGERY     disc removed     Family History Family History  Problem Relation Age of Onset   Hyperlipidemia Mother    Hypertension Mother    Diabetes Mother    Hypertension Father    Diabetes Sister    Heart attack Neg Hx    Sudden death Neg Hx     Social History Social History   Tobacco Use   Smoking status: Every Day    Current packs/day: 0.25    Types: Cigarettes   Smokeless tobacco: Never  Substance Use Topics   Alcohol use: No    Alcohol/week: 0.0 standard drinks of alcohol   Drug use: No   Allergies Hydrocodone, Ramelteon, and Tramadol  Review of Systems Review of Systems  All other systems reviewed and are negative.   Physical Exam Vital Signs  I have reviewed the triage vital signs BP 136/70 (BP Location: Right Arm)   Pulse (!) 57   Temp 98.2 F (36.8 C) (Oral)   Resp 12   Ht 5' 9.5" (1.765 m)   Wt 101.2 kg   SpO2 98%   BMI 32.46  kg/m  Physical Exam Vitals and nursing note reviewed.  Constitutional:      General: She is not in acute distress.    Appearance: She is well-developed.  HENT:     Head: Normocephalic and atraumatic.     Mouth/Throat:     Mouth: Mucous membranes are moist.  Eyes:     Pupils: Pupils are equal, round, and reactive to light.  Cardiovascular:     Rate and Rhythm: Normal rate and regular rhythm.     Heart sounds: No murmur heard. Pulmonary:     Effort: Pulmonary effort is normal. No respiratory distress.     Breath sounds: Normal breath sounds.  Abdominal:     General: Abdomen is flat.     Palpations: Abdomen is soft.     Tenderness: There is no abdominal tenderness. There is left CVA tenderness.  Musculoskeletal:        General: No tenderness.     Right lower leg: No edema.     Left lower leg: No edema.     Comments: Left low back paraspinal tenderness  Skin:    General: Skin is warm and dry.  Neurological:     General: No focal deficit present.     Mental Status: She is alert. Mental status is at baseline.  Psychiatric:        Mood and Affect: Mood normal.        Behavior: Behavior normal.     ED Results and Treatments Labs (all labs ordered are listed, but only abnormal results are displayed) Labs Reviewed  URINALYSIS, ROUTINE W REFLEX MICROSCOPIC - Abnormal; Notable for the following components:      Result Value   Glucose, UA >=500 (*)    All other components within normal limits  COMPREHENSIVE METABOLIC PANEL - Abnormal; Notable for the following components:   Glucose, Bld 106 (*)    Calcium 8.6 (*)    All other components within normal limits  CBC WITH DIFFERENTIAL/PLATELET - Abnormal; Notable for the following components:   RBC 5.29 (*)    Hemoglobin 15.3 (*)    HCT 46.5 (*)    All other components within normal limits  URINALYSIS, MICROSCOPIC (REFLEX) - Abnormal; Notable for the following components:   Bacteria, UA FEW (*)  All other components within  normal limits                                                                                                                          Radiology No results found.  Pertinent labs & imaging results that were available during my care of the patient were reviewed by me and considered in my medical decision making (see MDM for details).  Medications Ordered in ED Medications  sodium chloride flush (NS) 0.9 % injection 3 mL (3 mLs Intravenous Given 03/02/23 1326)  oxyCODONE-acetaminophen (PERCOCET/ROXICET) 5-325 MG per tablet 1 tablet (1 tablet Oral Given 03/02/23 1411)  ondansetron (ZOFRAN) injection 4 mg (4 mg Intravenous Given 03/02/23 1410)  sodium chloride 0.9 % bolus 1,000 mL (0 mLs Intravenous Stopped 03/02/23 1513)  oxyCODONE-acetaminophen (PERCOCET/ROXICET) 5-325 MG per tablet 1 tablet (1 tablet Oral Given 03/02/23 1558)                                                                                                                                     Procedures Procedures  (including critical care time)  Medical Decision Making / ED Course   MDM:  67 year old with nausea, fatigue, lightheadedness for 1 month with back pain today.  Patient overall well-appearing, vital signs reassuring, no fever, no tachycardia, blood pressure is reassuring, no hypoxia.  Physical exam overall nonfocal other than some left-sided back and CVA tenderness.  Will check labs to evaluate for anemia, electrolyte disturbance, dehydration, urinary infection.  EKG is reassuring.  Given CVA tenderness and history of kidney stones will obtain CT renal stone to evaluate for renal stone.  Back pain seems very musculoskeletal, patient is ambulatory no red flag symptoms, doubt spinal cord pression, occult infection in the back, cauda equina syndrome.  Lightheadedness while in the shower today but did not syncopized, orthostasis or vasovagal presyncope, will give fluids.  Will treat back pain.  Will reassess but if workup is  reassuring anticipate discharge.  Clinical Course as of 03/04/23 1502  Sun Mar 02, 2023  1516 Workup so far unremarkable.  Offered Toradol however patient declined as she reports she is allergic even though this is not listed on her medication allergy list.  She request additional Percocet.  Will give 1 additional .  CT with no obstructing stone or other acute process.  Signed out to oncoming provider  Dr. Donnald Garre pending urinalysis. [  WS]    Clinical Course User Index [WS] Lonell Grandchild, MD     Additional history obtained: -Additional history obtained from spouse  Lab Tests: -I ordered, reviewed, and interpreted labs.   The pertinent results include:   Labs Reviewed  URINALYSIS, ROUTINE W REFLEX MICROSCOPIC - Abnormal; Notable for the following components:      Result Value   Glucose, UA >=500 (*)    All other components within normal limits  COMPREHENSIVE METABOLIC PANEL - Abnormal; Notable for the following components:   Glucose, Bld 106 (*)    Calcium 8.6 (*)    All other components within normal limits  CBC WITH DIFFERENTIAL/PLATELET - Abnormal; Notable for the following components:   RBC 5.29 (*)    Hemoglobin 15.3 (*)    HCT 46.5 (*)    All other components within normal limits  URINALYSIS, MICROSCOPIC (REFLEX) - Abnormal; Notable for the following components:   Bacteria, UA FEW (*)    All other components within normal limits    Notable for mild elevated glucose, mild elevated hgb suggesting dehydration  EKG   EKG Interpretation Date/Time:  Sunday March 02 2023 13:24:36 EDT Ventricular Rate:  70 PR Interval:  156 QRS Duration:  93 QT Interval:  393 QTC Calculation: 424 R Axis:   5  Text Interpretation: Sinus rhythm Probable left atrial enlargement RSR' in V1 or V2, probably normal variant Confirmed by Alvino Blood ([id number]) on 03/02/2023 2:19:56 PM         Imaging Studies ordered: I ordered imaging studies including CT abdomen On my  interpretation imaging demonstrates no acute process I independently visualized and interpreted imaging. I agree with the radiologist interpretation   Medicines ordered and prescription drug management: Meds ordered this encounter  Medications   sodium chloride flush (NS) 0.9 % injection 3 mL   oxyCODONE-acetaminophen (PERCOCET/ROXICET) 5-325 MG per tablet 1 tablet   ondansetron (ZOFRAN) injection 4 mg   sodium chloride 0.9 % bolus 1,000 mL   oxyCODONE-acetaminophen (PERCOCET/ROXICET) 5-325 MG per tablet 1 tablet   ondansetron (ZOFRAN-ODT) 4 MG disintegrating tablet    Sig: Take 1 tablet (4 mg total) by mouth every 4 (four) hours as needed.    Dispense:  20 tablet    Refill:  0    -I have reviewed the patients home medicines and have made adjustments as needed    Social Determinants of Health:  Diagnosis or treatment significantly limited by social determinants of health: obesity   Reevaluation: After the interventions noted above, I reevaluated the patient and found that their symptoms have improved  Co morbidities that complicate the patient evaluation  Past Medical History:  Diagnosis Date   Anxiety    Chronic back pain    DDD (degenerative disc disease), cervical    Depression    Diabetes mellitus without complication (HCC)    High cholesterol    Hypertension    Rheumatoid arthritis(714.0)       Dispostion: Disposition decision including need for hospitalization was considered, and patient disposition pending at time of sign out.    Final Clinical Impression(s) / ED Diagnoses Final diagnoses:  Weakness  Nausea     This chart was dictated using voice recognition software.  Despite best efforts to proofread,  errors can occur which can change the documentation meaning.    Lonell Grandchild, MD 03/04/23 [phone]

## 2023-03-02 NOTE — ED Provider Notes (Signed)
Urinalysis negative, anticipate discharge home for follow-up with PCP. Physical Exam  BP 123/62   Pulse (!) 59   Temp 98.2 F (36.8 C) (Oral)   Resp 13   Ht 5' 9.5" (1.765 m)   Wt 101.2 kg   SpO2 98%   BMI 32.46 kg/m   Physical Exam  Procedures  Procedures  ED Course / MDM   Clinical Course as of 03/02/23 1609  Sun Mar 02, 2023  1516 Workup so far unremarkable.  Offered Toradol however patient declined as she reports she is allergic even though this is not listed on her medication allergy list.  She request additional Percocet.  Will give 1 additional .  CT with no obstructing stone or other acute process.  Signed out to oncoming provider  Dr. Donnald Garre pending urinalysis. [WS]    Clinical Course User Index [WS] Lonell Grandchild, MD   Medical Decision Making Amount and/or Complexity of Data Reviewed Labs: ordered. Radiology: ordered.  Risk Prescription drug management.  Urinalysis negative.  Patient is alert and nontoxic.  We reviewed test results and history of present illness.  Patient reports one of her predominant symptoms is nausea along with the fatigue.  At this time we will add Zofran for discharge.        Arby Barrette, MD 03/02/23 1610

## 2023-03-02 NOTE — ED Triage Notes (Signed)
Pt c/o nausea and fatigue x 4 wks; felt like she was going to pass out in the shower today

## 2023-03-02 NOTE — ED Notes (Signed)
No changes, alert, NAD, calm, interactive, to CT via stretcher.

## 2023-03-02 NOTE — ED Notes (Signed)
EDP at BS 

## 2023-03-02 NOTE — ED Notes (Signed)
Back from xray, IVF restarted, returned to monitor
